# Patient Record
Sex: Female | Born: 1950 | Race: White | Hispanic: No | Marital: Married | Smoking: Former smoker
Health system: Southern US, Community
[De-identification: ages and names within clinical notes are randomized; demographics above are authoritative.]

## PROBLEM LIST (undated history)

## (undated) DIAGNOSIS — F32A Depression, unspecified: Secondary | ICD-10-CM

## (undated) DIAGNOSIS — F329 Major depressive disorder, single episode, unspecified: Secondary | ICD-10-CM

## (undated) DIAGNOSIS — E7800 Pure hypercholesterolemia, unspecified: Secondary | ICD-10-CM

## (undated) DIAGNOSIS — I10 Essential (primary) hypertension: Secondary | ICD-10-CM

## (undated) DIAGNOSIS — I4819 Other persistent atrial fibrillation: Secondary | ICD-10-CM

## (undated) HISTORY — DX: Major depressive disorder, single episode, unspecified: F32.9

## (undated) HISTORY — DX: Other persistent atrial fibrillation: I48.19

## (undated) HISTORY — DX: Pure hypercholesterolemia, unspecified: E78.00

## (undated) HISTORY — DX: Depression, unspecified: F32.A

## (undated) HISTORY — DX: Essential (primary) hypertension: I10

---

## 2003-03-06 ENCOUNTER — Inpatient Hospital Stay (HOSPITAL_COMMUNITY): Admission: EM | Admit: 2003-03-06 | Discharge: 2003-03-09 | Payer: Self-pay | Admitting: Emergency Medicine

## 2003-03-07 ENCOUNTER — Encounter (INDEPENDENT_AMBULATORY_CARE_PROVIDER_SITE_OTHER): Payer: Self-pay | Admitting: Cardiology

## 2003-03-08 ENCOUNTER — Encounter: Payer: Self-pay | Admitting: Cardiology

## 2004-07-24 ENCOUNTER — Encounter: Admission: RE | Admit: 2004-07-24 | Discharge: 2004-07-24 | Payer: Self-pay | Admitting: Otolaryngology

## 2004-08-18 ENCOUNTER — Other Ambulatory Visit: Admission: RE | Admit: 2004-08-18 | Discharge: 2004-08-18 | Payer: Self-pay | Admitting: Otolaryngology

## 2005-12-25 ENCOUNTER — Other Ambulatory Visit: Admission: RE | Admit: 2005-12-25 | Discharge: 2005-12-25 | Payer: Self-pay | Admitting: Internal Medicine

## 2006-02-04 ENCOUNTER — Encounter: Admission: RE | Admit: 2006-02-04 | Discharge: 2006-02-04 | Payer: Self-pay | Admitting: Internal Medicine

## 2006-12-07 IMAGING — US US SOFT TISSUE HEAD/NECK
1 series · 14 of 25 positions shown · non-contrast
Comparison: none

CLINICAL DATA: Right thyroid nodule/lesion felt on physical exam.
THYROID ULTRASOUND:

[Series 1: unknown · 0.08mm/px · 14 of 45 slices shown]
[im 1/45]
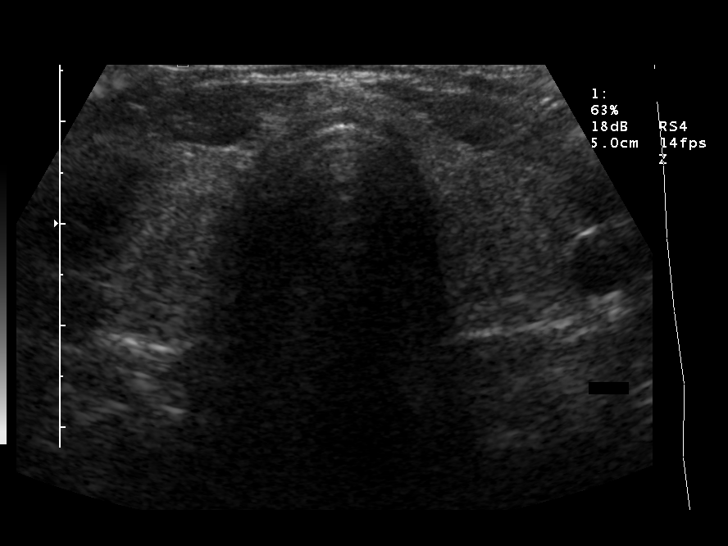
[im 4/45]
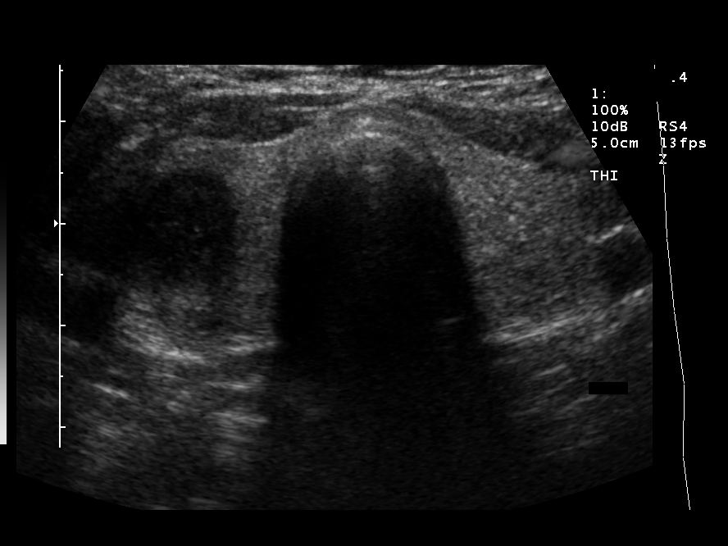
[im 8/45]
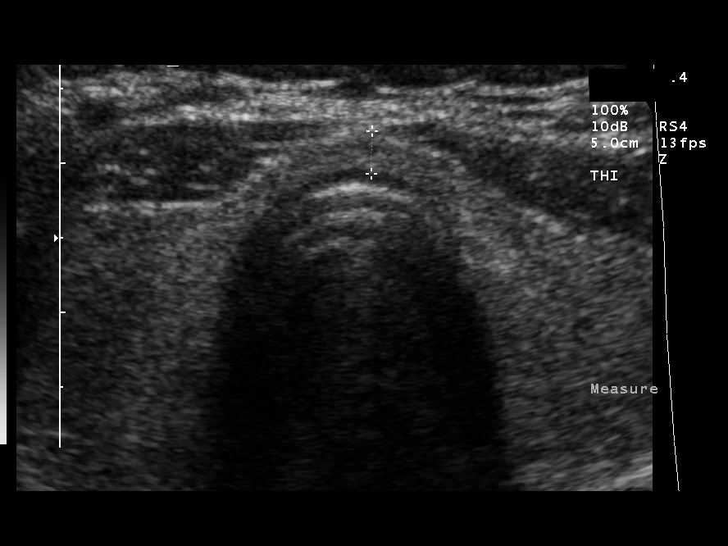
[im 12/45]
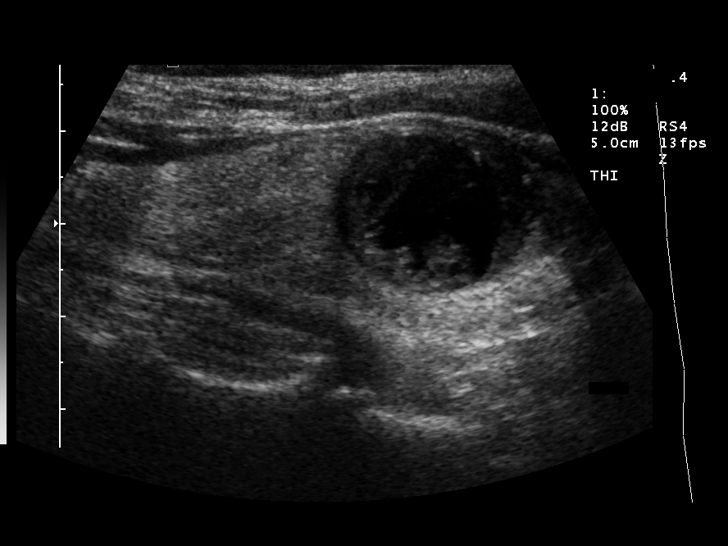
[im 15/45]
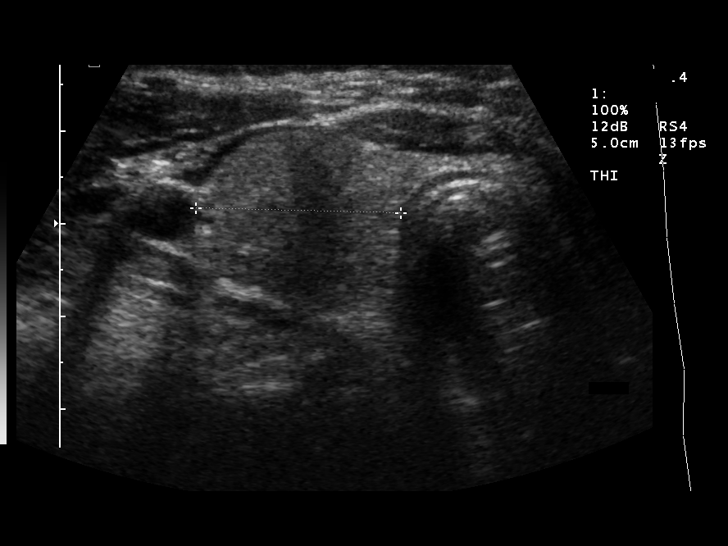
[im 17/45]
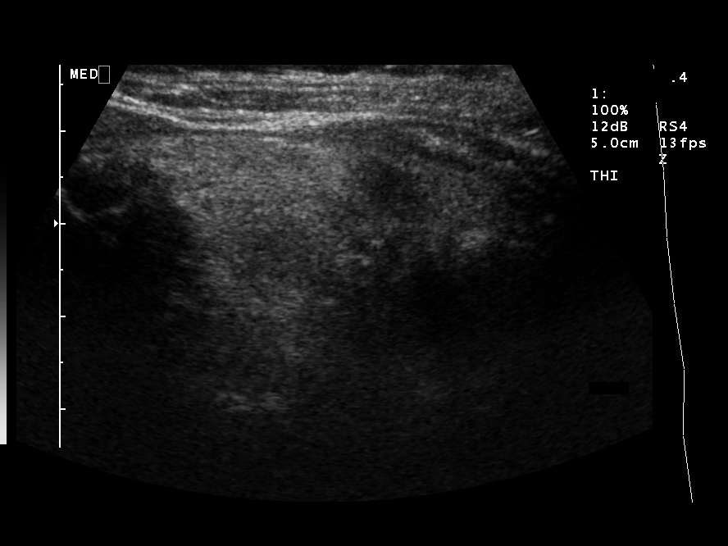
[im 21/45]
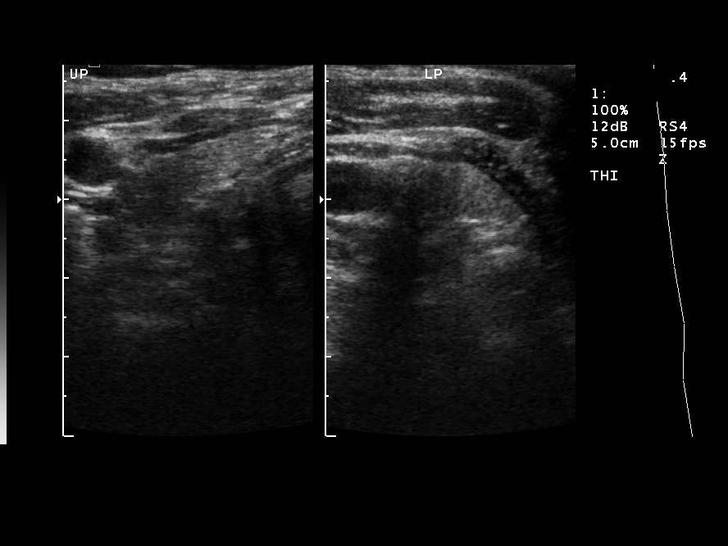
[im 24/45]
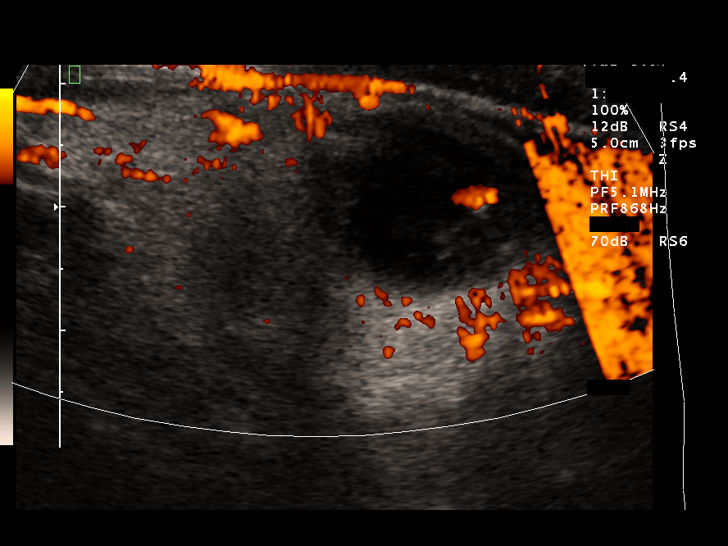
[im 28/45]
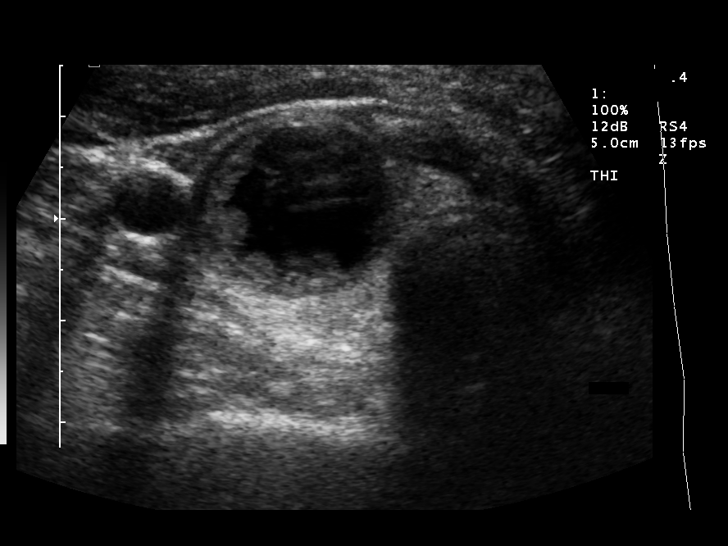
[im 30/45]
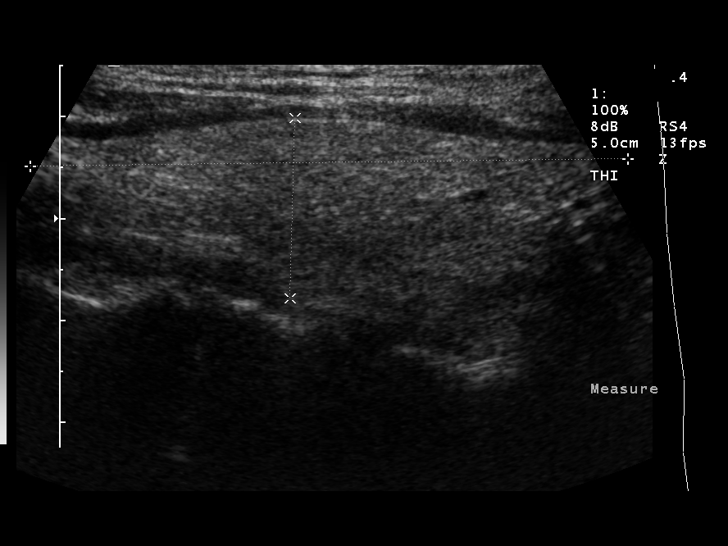
[im 34/45]
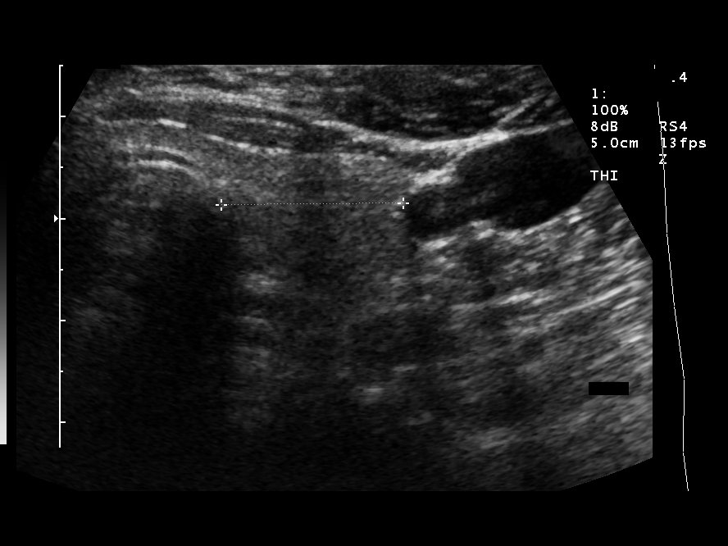
[im 37/45]
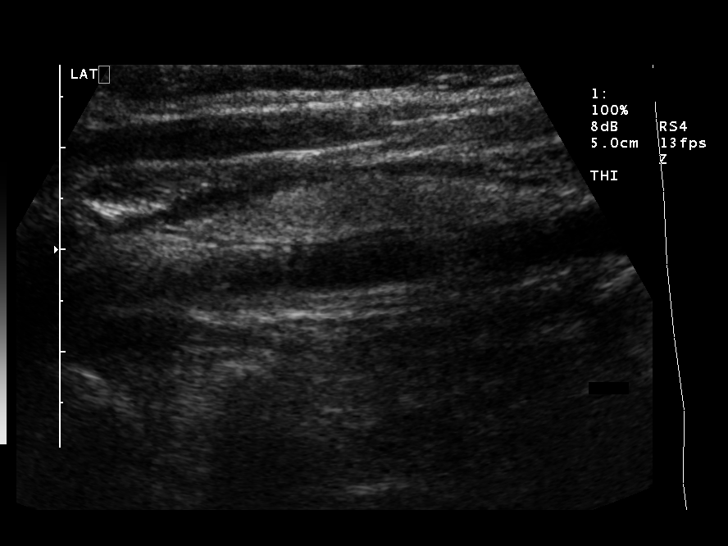
[im 41/45]
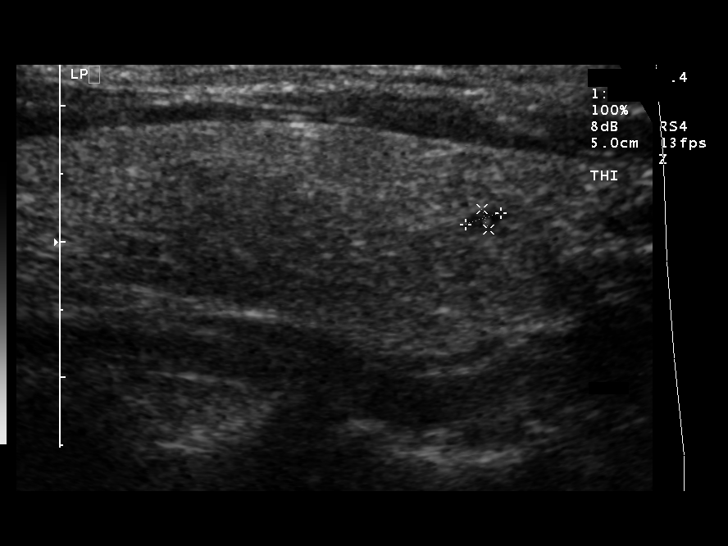
[im 45/45]
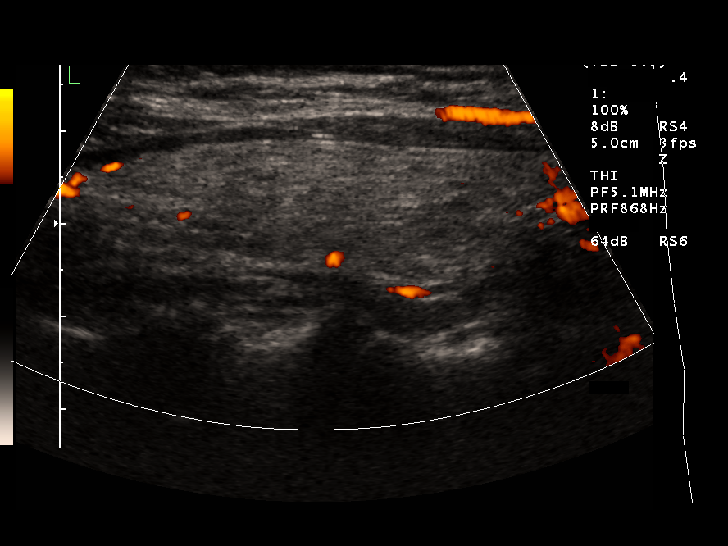

[14 of 25 positions shown; findings below may reference images not displayed]

FINDINGS: A 2.2 x 1.7 x 1.9 cm complex solid and cystic lesion containing small peripheral calcifications within the right mid thyroid is identified.  A 3 mm left lower pole thyroid nodule is also present.  The thyroid is mildly enlarged with the right lobe measuring 6.7 x 2.3 x 2.2 cm and the left lobe measuring 6.1 x 1.8 x 1.8 cm.  The remainder of the thyroid echotexture is homogeneous.
IMPRESSION: 1.  2.2 x 1.7 x 1.9 cm right thyroid nodule - recommend further evaluation as neoplasm is not excluded. 
2.  Mild diffuse thyroid enlargement.

## 2007-01-17 ENCOUNTER — Other Ambulatory Visit: Admission: RE | Admit: 2007-01-17 | Discharge: 2007-01-17 | Payer: Self-pay | Admitting: Internal Medicine

## 2010-07-18 NOTE — H&P (Signed)
NAMESHANTEE, HAYNE                         ACCOUNT NO.:  [id number]   MEDICAL RECORD NO.:  [id number]                   PATIENT TYPE:  INP   LOCATION:  0153                                 FACILITY:  [facility]   PHYSICIAN:  Deirdre Peer. Polite, M.D.              DATE OF BIRTH:  11/01/1950   DATE OF ADMISSION:  03/06/2003  DATE OF DISCHARGE:                                HISTORY & PHYSICAL   CHIEF COMPLAINT:  Shortness of breath and palpitations.   HISTORY OF PRESENT ILLNESS:  Ms. Milner is a 60 year old female who is  fairly healthy and presented to the ED for evaluation of shortness of breath  and palpitations.  The patient states she was in her usual state of health  until this afternoon when she experienced a sensation of fluttering in her  chest and shortness of breath as well as chest heaviness.  The patient  experienced these symptoms while she was at a movie theater.  Because of the  above symptoms the patient presented to the ED for evaluation.  In the ED  the patient was found to be hypertensive with a BP of 203/124 and EKG  showing atrial fibrillation with a rate of 161.  The patient had a complaint  of chest pain and was given nitroglycerin, which she states gave her relief  of her chest discomfort, and the patient was treated with IV medications,  which improved her rate down to 75 beats/minute.  The patient's cardiac  enzymes at this time were within normal limits.  [facility] Hospitalist was  called for further evaluation and treatment.   The patient states that she has not had trouble with her heart before.  No  history of atrial fibrillation, but thinks that approximately four months  ago she felt the sensation of heaviness and palpitations in her chest, which  resolved on their own.   The patient does not smoke, does not have diabetes, no known history of high  cholesterol and no known history of coronary artery disease.  The patient  has no history of thyroid disorder  either.  The patient believes she has not  been feeling well all this week, feeling somewhat short of breath, but no  chest pressure until today as described above.   The patient denies any medications other than a multivitamin.  The patient  drinks alcohol socially.  the patient denies any fever or chills, any nausea  or vomiting, and any diarrhea, but has occasional constipation.  No change  in her stool or urine.   PAST MEDICAL HISTORY:  The past medical history is negative for any current  medical problems.   MEDICATIONS:  Occasional multivitamin.  No herbal medicines.   SOCIAL HISTORY:  Social history is negative for tobacco. Social alcohol;  approximately a glass of wine per day.  No drugs.   PAST SURGICAL HISTORY:  None.   ALLERGIES:  No known drug  allergies.   FAMILY HISTORY:  The patient states that her mother is fairly healthy.  Her  father is deceased of unknown cause.  The patient has a brother and a sister  who are healthy.   REVIEW OF SYSTEMS:  Review of systems is as stated in the initial HPI.   PHYSICAL EXAMINATION:  VITAL SIGNS:  Temp 97.0.  BP on admission 203/124 and  BP at the time of my evaluation 117/82.  Satting 97%.  HEENT:  Within normal limits.  CHEST:  Chest is clear.  CARDIOVASCULAR:  Irregularly irregular.  ABDOMEN:  Abdomen is soft and nontender.  EXTREMITIES:  No edema.  Two plus pulse.   LABORATORY DATA:  Chest x-ray; mo acute abnormality.  EKG; atrial  fibrillation.  CBC within normal limits; white count 7.3, hemoglobin 16.3,  MCV 87.9 and platelets 258,000.  INR 0.7.  BMET; sodium 140, potassium 3.4,  chloride 110, carbon dioxide 25, glucose 102, BUN 7, and creatinine 0.7.  Calcium 9.3.  CK 134, CK MB 2.7, positive index 2.0.  Troponin I 0.01.   ASSESSMENT AND PLAN:  1. Atrial fibrillation, new onset.  2. Chest pain relieved by nitroglycerin.   RECOMMEND:  Recommend the patient be admitted to a telemetry floor bed.  The  patient will  require serial cardiac enzymes to rule out an ischemic cause  for her new onset atrial fibrillation.  The patient will have a TSH level  drawn as well as a lipid panel drawn.  The patient will continue on IV  Cardizem for rate control with hopefully conversion to p.o. medicine in the  morning.  The patient will be started on anticoagulation to prevent embolic  phenomenon for her new onset atrial fibrillation.  We will obtain a 2-D echo  and a cardiac evaluation.  We will make further recommendations after review  of the above studies.                                               Deirdre Peer. Polite, M.D.    RDP/MEDQ  D:  03/06/2003  T:  03/07/2003  Job:  [id number]

## 2010-07-18 NOTE — Discharge Summary (Signed)
Jennifer Webster, Jennifer Webster                         ACCOUNT NO.:  [id number]   MEDICAL RECORD NO.:  [id number]                   PATIENT TYPE:  INP   LOCATION:  4729                                 FACILITY:  [facility]   PHYSICIAN:  Ike Bene, M.D.              DATE OF BIRTH:  1950/09/25   DATE OF ADMISSION:  03/06/2003  DATE OF DISCHARGE:  03/09/2003                                 DISCHARGE SUMMARY   DISCHARGE DIAGNOSES:  1. New-onset atrial fibrillation.  2. Hypertension.   [name] was initially admitted with sudden onset weakness, shortness of  breath, a fluttering sensation she had in her chest.  She was found to be in  atrial fibrillation, was initially admitted to [facility].  She  had not had any particular stimulants or other reasons for her to develop  new-onset atrial fibrillation.  Her rate was initially well controlled.  She  was placed on digoxin and diltiazem and anticoagulated with heparin.  Cardiac isoenzymes were normal.  Thyroid functions, electrolytes, CBC all  essentially were in normal limits.  Cardiac consultation was obtained.  Dr.  Sharyn Lull evaluated the patient.  He obtained an echocardiogram which showed  inferior hypokinesis and arranged for her to have a nuclear medicine  Cardiolite study which apparently was normal.   The patient did convert to normal sinus rhythm on Betapace.  She was feeling  very well at the time of discharge.  Decision was made that she could be  discharged to home on therapy of Coumadin along with Betapace to control her  heart rate and to keep her in normal sinus rhythm.  She was to follow up  with myself and Dr. Sharyn Lull within a couple of weeks after discharge.   On review of her laboratories, all of her laboratories including a CBC,  metabolic panel, thyroid studies, cardiac enzymes, lipid panel - all were  within normal limits.   The patient was discharged to home in significantly improved condition in  normal  sinus rhythm.  The patient was discharged home on the following  medications:  1. Betapace 40 mg one-half tablet twice daily.  2. Coumadin 5 mg daily.   She was to follow up with Dr. Merril Abbe on Monday, March 12, 2003 and  repeat a protime at that time.  Also, to follow up with Dr. Sharyn Lull as he  recommended - which I think was approximately 2 weeks after discharge.                                                Ike Bene, M.D.    RM/MEDQ  D:  04/12/2003  T:  04/12/2003  Job:  [id number]

## 2012-12-07 ENCOUNTER — Ambulatory Visit: Payer: Self-pay | Admitting: Cardiology

## 2013-01-12 ENCOUNTER — Encounter: Payer: Self-pay | Admitting: *Deleted

## 2013-01-12 ENCOUNTER — Encounter: Payer: Self-pay | Admitting: Cardiology

## 2013-01-12 DIAGNOSIS — I10 Essential (primary) hypertension: Secondary | ICD-10-CM | POA: Insufficient documentation

## 2013-01-12 DIAGNOSIS — F329 Major depressive disorder, single episode, unspecified: Secondary | ICD-10-CM | POA: Insufficient documentation

## 2013-01-12 DIAGNOSIS — F32A Depression, unspecified: Secondary | ICD-10-CM | POA: Insufficient documentation

## 2013-01-12 DIAGNOSIS — E7800 Pure hypercholesterolemia, unspecified: Secondary | ICD-10-CM | POA: Insufficient documentation

## 2013-01-13 ENCOUNTER — Encounter: Payer: Self-pay | Admitting: Cardiology

## 2013-01-13 ENCOUNTER — Encounter: Payer: Self-pay | Admitting: General Surgery

## 2013-01-13 ENCOUNTER — Ambulatory Visit (INDEPENDENT_AMBULATORY_CARE_PROVIDER_SITE_OTHER): Payer: Self-pay | Admitting: Cardiology

## 2013-01-13 VITALS — BP 180/100 | HR 130 | Ht 65.0 in | Wt 150.0 lb

## 2013-01-13 DIAGNOSIS — I4819 Other persistent atrial fibrillation: Secondary | ICD-10-CM | POA: Insufficient documentation

## 2013-01-13 DIAGNOSIS — I480 Paroxysmal atrial fibrillation: Secondary | ICD-10-CM

## 2013-01-13 DIAGNOSIS — I4891 Unspecified atrial fibrillation: Secondary | ICD-10-CM

## 2013-01-13 DIAGNOSIS — I10 Essential (primary) hypertension: Secondary | ICD-10-CM

## 2013-01-13 LAB — BASIC METABOLIC PANEL
CO2: 28 mEq/L (ref 19–32)
Calcium: 9.7 mg/dL (ref 8.4–10.5)
Creatinine, Ser: 0.9 mg/dL (ref 0.4–1.2)
Glucose, Bld: 90 mg/dL (ref 70–99)
Potassium: 4.2 mEq/L (ref 3.5–5.1)
Sodium: 138 mEq/L (ref 135–145)

## 2013-01-13 NOTE — Progress Notes (Signed)
  [address], [address] [place], [place]  [address] Phone: [phone] Fax:  [phone]  Date:  01/13/2013   ID:  Jennifer Webster, DOB 1950/07/01, MRN [id number]  PCP:  No primary provider on file.  Cardiologist:  Armanda Magic, MD     History of Present Illness: Jennifer Webster is a 62 y.o. female with a history of PAF and HTN who presents today for followup.  She is doing well.  She denies any chest pain, SOB, DOE, LE edema, dizziness, palpitations or syncope.   Wt Readings from Last 3 Encounters:  01/13/13 150 lb (68.04 kg)     Past Medical History  Diagnosis Date  . Pure hypercholesterolemia   . Depression   . HTN (hypertension)   . Paroxysmal atrial fibrillation     Current Outpatient Prescriptions  Medication Sig Dispense Refill  . aspirin 325 MG tablet Take 325 mg by mouth daily.      . flecainide (TAMBOCOR) 100 MG tablet Take 100 mg by mouth 2 (two) times daily.      Marland Kitchen lisinopril (PRINIVIL,ZESTRIL) 20 MG tablet Take 20 mg by mouth daily.      . metoprolol tartrate (LOPRESSOR) 25 MG tablet Take 12.5 mg by mouth 2 (two) times daily.       No current facility-administered medications for this visit.    Allergies:   No Known Allergies  Social History:  The patient  reports that she has quit smoking. She does not have any smokeless tobacco history on file. She reports that she drinks alcohol. She reports that she does not use illicit drugs.   Family History:  The patient's family history is not on file.   ROS:  Please see the history of present illness.      All other systems reviewed and negative.   PHYSICAL EXAM: VS:  BP 180/100  Pulse 130  Ht 5\' 5"  (1.651 m)  Wt 150 lb (68.04 kg)  BMI 24.96 kg/m2 Well nourished, well developed, in no acute distress HEENT: normal Neck: no JVD Cardiac:  normal S1, S2; RRR; no murmur Lungs:  clear to auscultation bilaterally, no wheezing, rhonchi or rales Abd: soft, nontender, no hepatomegaly Ext: no edema Skin:  warm and dry Neuro:  CNs 2-12 intact, no focal abnormalities noted       ASSESSMENT AND PLAN:  1. PAF maintaining NSR  - continue metoprolol/ASA/Flecainide 2. HTN - markedly elevated.  At home her BP runs 138-157/60-60mmHg  - continue metoprolol and Lisinopril  - check BMET   Followup with me in 6 months  Signed, Armanda Magic, MD 01/13/2013 11:01 AM

## 2013-01-13 NOTE — Patient Instructions (Signed)
Your physician recommends that you continue on your current medications as directed. Please refer to the Current Medication list given to you today.  Your physician recommends that you go to the lab today for Bmet Panel  Your physician wants you to follow-up in: 6 Months with Dr. Sherlyn Lick will receive a reminder letter in the mail two months in advance. If you don't receive a letter, please call our office to schedule the follow-up appointment.

## 2013-05-10 ENCOUNTER — Other Ambulatory Visit: Payer: Self-pay

## 2013-05-10 MED ORDER — METOPROLOL TARTRATE 25 MG PO TABS: 12.5000 mg | ORAL_TABLET | Freq: Two times a day (BID) | ORAL | Status: DC

## 2013-05-15 ENCOUNTER — Other Ambulatory Visit: Payer: Self-pay

## 2013-05-15 ENCOUNTER — Other Ambulatory Visit: Payer: Self-pay | Admitting: *Deleted

## 2013-05-15 MED ORDER — LISINOPRIL 20 MG PO TABS: 20.0000 mg | ORAL_TABLET | Freq: Every day | ORAL | Status: DC

## 2013-05-15 MED ORDER — METOPROLOL TARTRATE 25 MG PO TABS
12.5000 mg | ORAL_TABLET | Freq: Two times a day (BID) | ORAL | Status: DC
Start: 2013-05-15 — End: 2013-05-22

## 2013-05-22 ENCOUNTER — Other Ambulatory Visit: Payer: Self-pay | Admitting: *Deleted

## 2013-05-22 MED ORDER — METOPROLOL TARTRATE 25 MG PO TABS: 12.5000 mg | ORAL_TABLET | Freq: Two times a day (BID) | ORAL | Status: DC

## 2013-09-27 ENCOUNTER — Other Ambulatory Visit: Payer: Self-pay

## 2013-09-27 MED ORDER — LISINOPRIL 20 MG PO TABS: 20.0000 mg | ORAL_TABLET | Freq: Every day | ORAL | Status: DC

## 2013-10-02 ENCOUNTER — Other Ambulatory Visit: Payer: Self-pay | Admitting: *Deleted

## 2013-10-02 MED ORDER — FLECAINIDE ACETATE 100 MG PO TABS: 100.0000 mg | ORAL_TABLET | Freq: Two times a day (BID) | ORAL | Status: DC

## 2013-10-25 ENCOUNTER — Ambulatory Visit: Payer: Self-pay | Admitting: Physician Assistant

## 2013-10-25 ENCOUNTER — Encounter: Payer: Self-pay | Admitting: Physician Assistant

## 2013-10-25 ENCOUNTER — Ambulatory Visit (INDEPENDENT_AMBULATORY_CARE_PROVIDER_SITE_OTHER): Payer: Self-pay | Admitting: Physician Assistant

## 2013-10-25 VITALS — BP 170/110 | HR 60 | Ht 65.0 in | Wt 153.0 lb

## 2013-10-25 DIAGNOSIS — I480 Paroxysmal atrial fibrillation: Secondary | ICD-10-CM

## 2013-10-25 DIAGNOSIS — I4891 Unspecified atrial fibrillation: Secondary | ICD-10-CM

## 2013-10-25 DIAGNOSIS — R030 Elevated blood-pressure reading, without diagnosis of hypertension: Secondary | ICD-10-CM

## 2013-10-25 DIAGNOSIS — IMO0001 Reserved for inherently not codable concepts without codable children: Secondary | ICD-10-CM

## 2013-10-25 DIAGNOSIS — I10 Essential (primary) hypertension: Secondary | ICD-10-CM

## 2013-10-25 MED ORDER — LISINOPRIL 20 MG PO TABS: 20.0000 mg | ORAL_TABLET | Freq: Two times a day (BID) | ORAL | Status: DC

## 2013-10-25 NOTE — Progress Notes (Addendum)
Cardiology Office Note    Date:  10/25/2013   ID:  Jennifer Webster, Jennifer Webster [birth year], MRN [id number]  PCP:  No primary provider on file.  Cardiologist:  Dr. Mayford Knife    History of Present Illness: Jennifer Webster is a 63 y.o. female PAF and HTN who presents today for followup.   She has not had any problems with her atrial fibrillation and has been in NSR since 2005 at the time of diagnosis. She did take coumadin for a while but then it was decided to only continue on ASA to obviate routine monitoring and associated costs. She denies any chest pain, SOB, DOE, LE edema, dizziness, palpitations or syncope. She mows her large yard every week and feels great after doing this. She does not follow up with a primary care doctor and does not have insurance. She refused an ECG this visit as she recently had on one on her last visit in 12/2012.     Recent Labs/Images: 01/13/2013: Creatinine 0.9; Potassium 4.2   No results found.   Wt Readings from Last 3 Encounters:  01/13/13 150 lb (68.04 kg)     Past Medical History  Diagnosis Date  . Pure hypercholesterolemia   . Depression   . HTN (hypertension)   . Paroxysmal atrial fibrillation     Current Outpatient Prescriptions  Medication Sig Dispense Refill  . aspirin 325 MG tablet Take 325 mg by mouth daily.      . flecainide (TAMBOCOR) 100 MG tablet Take 1 tablet (100 mg total) by mouth 2 (two) times daily.  60 tablet  0  . lisinopril (PRINIVIL,ZESTRIL) 20 MG tablet Take 1 tablet (20 mg total) by mouth daily.  30 tablet  2  . metoprolol tartrate (LOPRESSOR) 25 MG tablet Take 0.5 tablets (12.5 mg total) by mouth 2 (two) times daily.  90 tablet  1   No current facility-administered medications for this visit.     Allergies:   Review of patient's allergies indicates no known allergies.   Social History:  The patient  reports that she has quit smoking. She does not have any smokeless tobacco history on file. She reports that she  drinks alcohol. She reports that she does not use illicit drugs.   Family History:  The patient's family history is not on file.   ROS:  Please see the history of present illness. All other systems reviewed and negative.   PHYSICAL EXAM: VS:  There were no vitals taken for this visit. Well nourished, well developed, in no acute distress HEENT: normal Neck: no JVD Cardiac:  normal S1, S2; RRR; no murmur Lungs: clear to auscultation bilaterally, no wheezing, rhonchi or rales Abd: soft, nontender, no hepatomegaly Ext: no edema Skin: warm and dry Neuro:  CNs 2-12 intact, no focal abnormalities noted  EKG:  Patient declined.      ASSESSMENT AND PLAN:  PAF maintaining NSR - continue metoprolol/ASA/Flecainide    HTN - 170/110 markedly elevated. She admits to white coat hypertension. She takes her BP about 1x a month and usually runs around ~148/5mmHg.  - continue metoprolol and Lisinopril. Her BP is not at goal and would ideally like her SBP to be under 140. I recommended adding an additional agent like Norvac 2.5 mg but the patient refused. I told her to keep an eye on her BP and think about adding another agent at her next visit if her pressures remain high. She agreed.   Disposition:  67mo with Dr. Mayford Knife  Signed, Delia Chimes, MHS 10/25/2013 11:05 AM    [facility] [address] [place], [place], [place]  [address] Phone: [phone]; Fax: [phone]

## 2013-10-25 NOTE — Addendum Note (Signed)
Addended by: Tarri Fuller on: 10/25/2013 12:10 PM   Modules accepted: Orders

## 2013-10-25 NOTE — Patient Instructions (Signed)
Your physician wants you to follow-up in: 6 MONTHS WITH DR. Mayford Knife.  You will receive a reminder letter in the mail two months in advance. If you don't receive a letter, please call our office to schedule the follow-up appointment.   NO CHANGES WERE MADE TODAY WITH MEDICATIONS

## 2013-11-07 ENCOUNTER — Other Ambulatory Visit: Payer: Self-pay | Admitting: *Deleted

## 2013-11-07 MED ORDER — FLECAINIDE ACETATE 100 MG PO TABS: 100.0000 mg | ORAL_TABLET | Freq: Two times a day (BID) | ORAL | Status: DC

## 2014-04-04 ENCOUNTER — Other Ambulatory Visit: Payer: Self-pay | Admitting: Cardiology

## 2014-05-30 ENCOUNTER — Other Ambulatory Visit: Payer: Self-pay | Admitting: Cardiology

## 2014-08-02 NOTE — Progress Notes (Signed)
Cardiology Office Note   Date:  08/03/2014   ID:  Jennifer Webster, DOB February 23, 1951, MRN [id number]  PCP:  No primary care provider on file.    Chief Complaint  Patient presents with  . Follow-up    PAF      History of Present Illness: Jennifer Webster is a 64 y.o. female with a history of PAF and HTN who presents today for followup. She is doing well. She denies any chest pain, SOB, DOE, LE edema, dizziness, palpitations or syncope.     Past Medical History  Diagnosis Date  . Pure hypercholesterolemia   . Depression   . HTN (hypertension)   . Paroxysmal atrial fibrillation     History reviewed. No pertinent past surgical history.   Current Outpatient Prescriptions  Medication Sig Dispense Refill  . aspirin 325 MG tablet Take 325 mg by mouth daily.    . flecainide (TAMBOCOR) 100 MG tablet Take 1 tablet (100 mg total) by mouth 2 (two) times daily. 60 tablet 6  . lisinopril (PRINIVIL,ZESTRIL) 20 MG tablet Take 1 tablet (20 mg total) by mouth 2 (two) times daily. 180 tablet 3  . metoprolol tartrate (LOPRESSOR) 25 MG tablet Take 0.5 tablets (12.5 mg total) by mouth 2 (two) times daily. 90 tablet 1   No current facility-administered medications for this visit.    Allergies:   Review of patient's allergies indicates no known allergies.    Social History:  The patient  reports that she has quit smoking. She does not have any smokeless tobacco history on file. She reports that she drinks alcohol. She reports that she does not use illicit drugs.   Family History:  The patient's family history includes Diabetes in her daughter. There is no history of Heart attack.    ROS:  Please see the history of present illness.   Otherwise, review of systems are positive for none.   All other systems are reviewed and negative.    PHYSICAL EXAM: VS:  BP 190/98 mmHg  Pulse 56  Ht 5\' 5"  (1.651 m)  Wt 164 lb 12.8 oz (74.753 kg)  BMI 27.42 kg/m2 , BMI Body mass  index is 27.42 kg/(m^2). GEN: Well nourished, well developed, in no acute distress HEENT: normal Neck: no JVD, carotid bruits, or masses Cardiac: RRR; no murmurs, rubs, or gallops,no edema  Respiratory:  clear to auscultation bilaterally, normal work of breathing GI: soft, nontender, nondistended, + BS MS: no deformity or atrophy Skin: warm and dry, no rash Neuro:  Strength and sensation are intact Psych: euthymic mood, full affect   EKG:  EKG was ordered today and showed sinus bradycardia at 56 bpm with no ST changes and normal intervals    Recent Labs: No results found for requested labs within last 365 days.    Lipid Panel No results found for: CHOL, TRIG, HDL, CHOLHDL, VLDL, LDLCALC, LDLDIRECT    Wt Readings from Last 3 Encounters:  08/03/14 164 lb 12.8 oz (74.753 kg)  10/25/13 153 lb (69.4 kg)  01/13/13 150 lb (68.04 kg)    ASSESSMENT AND PLAN:  1. PAF maintaining NSR - continue metoprolol/ASA/Flecainide 2. HTN - poorly controlled but she has white coat HTN.  I have asked to check her BP daily for a week and call with the results - continue metoprolol and Lisinopril     Current medicines are reviewed at length with the patient  today.  The patient does not have concerns regarding medicines.  The following changes have been made:  no change  Labs/ tests ordered today: See above Assessment and Plan No orders of the defined types were placed in this encounter.     Disposition:   FU with me in 6 months  Signed, Quintella Reichert, MD  08/03/2014 10:21 AM    [facility] [address] [place], [place], [place]  [address] Phone: [phone]; Fax: [phone]

## 2014-08-03 ENCOUNTER — Ambulatory Visit (INDEPENDENT_AMBULATORY_CARE_PROVIDER_SITE_OTHER): Payer: Self-pay | Admitting: Cardiology

## 2014-08-03 ENCOUNTER — Encounter: Payer: Self-pay | Admitting: Cardiology

## 2014-08-03 VITALS — BP 190/98 | HR 56 | Ht 65.0 in | Wt 164.8 lb

## 2014-08-03 DIAGNOSIS — I10 Essential (primary) hypertension: Secondary | ICD-10-CM

## 2014-08-03 DIAGNOSIS — I480 Paroxysmal atrial fibrillation: Secondary | ICD-10-CM

## 2014-08-03 MED ORDER — METOPROLOL TARTRATE 25 MG PO TABS: 12.5000 mg | ORAL_TABLET | Freq: Two times a day (BID) | ORAL | Status: DC

## 2014-08-03 NOTE — Patient Instructions (Signed)

## 2014-08-15 ENCOUNTER — Telehealth: Payer: Self-pay

## 2014-08-15 NOTE — Telephone Encounter (Signed)
Stable BP continue current meds 

## 2014-08-15 NOTE — Telephone Encounter (Signed)
Informed patient of results and verbal understanding expressed.  

## 2014-08-15 NOTE — Telephone Encounter (Signed)
Received letter from patient with recent VS readings taken 2 hours after medications:  6/4: BP 144/65 HR 58 6/5: BP 125/55 HR 60 6/6: BP 137/60 HR 54 6/7: BP 128/59 HR 61 6/8: BP 145/66 HR 60  To Dr. Mayford Knife for review.

## 2014-10-11 ENCOUNTER — Other Ambulatory Visit: Payer: Self-pay | Admitting: Physician Assistant

## 2014-11-07 ENCOUNTER — Other Ambulatory Visit: Payer: Self-pay | Admitting: Cardiology

## 2015-02-08 ENCOUNTER — Ambulatory Visit (INDEPENDENT_AMBULATORY_CARE_PROVIDER_SITE_OTHER): Payer: Medicare Other | Admitting: Cardiology

## 2015-02-08 ENCOUNTER — Encounter: Payer: Self-pay | Admitting: Cardiology

## 2015-02-08 VITALS — BP 146/92 | HR 68 | Ht 65.0 in | Wt 168.4 lb

## 2015-02-08 DIAGNOSIS — I480 Paroxysmal atrial fibrillation: Secondary | ICD-10-CM | POA: Diagnosis not present

## 2015-02-08 DIAGNOSIS — I4891 Unspecified atrial fibrillation: Secondary | ICD-10-CM | POA: Diagnosis not present

## 2015-02-08 DIAGNOSIS — I10 Essential (primary) hypertension: Secondary | ICD-10-CM | POA: Diagnosis not present

## 2015-02-08 LAB — CBC WITH DIFFERENTIAL/PLATELET
BASOS ABS: 0 10*3/uL (ref 0.0–0.1)
Basophils Relative: 0 % (ref 0–1)
EOS PCT: 3 % (ref 0–5)
Eosinophils Absolute: 0.2 10*3/uL (ref 0.0–0.7)
HEMATOCRIT: 42.7 % (ref 36.0–46.0)
Hemoglobin: 14.3 g/dL (ref 12.0–15.0)
LYMPHS ABS: 1.6 10*3/uL (ref 0.7–4.0)
LYMPHS PCT: 24 % (ref 12–46)
MCH: 30.4 pg (ref 26.0–34.0)
MCHC: 33.5 g/dL (ref 30.0–36.0)
MCV: 90.7 fL (ref 78.0–100.0)
MONOS PCT: 8 % (ref 3–12)
MPV: 10.1 fL (ref 8.6–12.4)
Monocytes Absolute: 0.5 10*3/uL (ref 0.1–1.0)
Neutro Abs: 4.4 10*3/uL (ref 1.7–7.7)
Neutrophils Relative %: 65 % (ref 43–77)
Platelets: 251 10*3/uL (ref 150–400)
RBC: 4.71 MIL/uL (ref 3.87–5.11)
RDW: 13 % (ref 11.5–15.5)
WBC: 6.7 10*3/uL (ref 4.0–10.5)

## 2015-02-08 LAB — BASIC METABOLIC PANEL
BUN: 13 mg/dL (ref 7–25)
CHLORIDE: 104 mmol/L (ref 98–110)
CO2: 25 mmol/L (ref 20–31)
Calcium: 9.5 mg/dL (ref 8.6–10.4)
Creat: 1.02 mg/dL — ABNORMAL HIGH (ref 0.50–0.99)
GLUCOSE: 92 mg/dL (ref 65–99)
POTASSIUM: 4.3 mmol/L (ref 3.5–5.3)
Sodium: 139 mmol/L (ref 135–146)

## 2015-02-08 MED ORDER — RIVAROXABAN 20 MG PO TABS: 20.0000 mg | ORAL_TABLET | Freq: Every day | ORAL | Status: DC

## 2015-02-08 MED ORDER — FLECAINIDE ACETATE 100 MG PO TABS: 100.0000 mg | ORAL_TABLET | Freq: Two times a day (BID) | ORAL | Status: DC

## 2015-02-08 NOTE — Patient Instructions (Addendum)
Medication Instructions:  1) STOP ASPIRIN 2) START XARELTO 20 mg daily  Labwork: TODAY: BMET, CBC  Testing/Procedures: None  Follow-Up: Your physician wants you to follow-up in: 6 months with Dr. Mayford Knifeurner. You will receive a reminder letter in the mail two months in advance. If you don't receive a letter, please call our office to schedule the follow-up appointment.   Any Other Special Instructions Will Be Listed Below (If Applicable).     If you need a refill on your cardiac medications before your next appointment, please call your pharmacy.

## 2015-02-08 NOTE — Progress Notes (Signed)
Cardiology Office Note   Date:  02/08/2015   ID:  Jennifer, Webster Jun 28, 1950, MRN [id number]  PCP:  No PCP Per Patient    Chief Complaint  Patient presents with  . Atrial Fibrillation    pt has no complaints  . Hypertension      History of Present Illness: Jennifer Webster is a 64 y.o. female with a history of PAF and HTN who presents today for followup. She is doing well. She denies any chest pain, SOB, DOE, LE edema, dizziness, palpitations, claudication or syncope.   Past Medical History  Diagnosis Date  . Pure hypercholesterolemia   . Depression   . HTN (hypertension)   . Paroxysmal atrial fibrillation (HCC)     No past surgical history on file.   Current Outpatient Prescriptions  Medication Sig Dispense Refill  . aspirin 325 MG tablet Take 325 mg by mouth daily.    . flecainide (TAMBOCOR) 100 MG tablet Take 1 tablet (100 mg total) by mouth 2 (two) times daily. 60 tablet 1  . lisinopril (PRINIVIL,ZESTRIL) 20 MG tablet TAKE ONE TABLET BY MOUTH TWICE DAILY 180 tablet 1  . metoprolol tartrate (LOPRESSOR) 25 MG tablet Take 0.5 tablets (12.5 mg total) by mouth 2 (two) times daily. 90 tablet 3   No current facility-administered medications for this visit.    Allergies:   Review of patient's allergies indicates no known allergies.    Social History:  The patient  reports that she has quit smoking. She does not have any smokeless tobacco history on file. She reports that she drinks alcohol. She reports that she does not use illicit drugs.   Family History:  The patient's family history includes Diabetes in her daughter. There is no history of Heart attack.    ROS:  Please see the history of present illness.   Otherwise, review of systems are positive for none.   All other systems are reviewed and negative.    PHYSICAL EXAM: VS:  BP 146/92 mmHg  Pulse 68  Ht  (1.651 m)  Wt 168 lb 6.4 oz (76.386 kg)  BMI 28.02 kg/m2  SpO2 96% ,  BMI Body mass index is 28.02 kg/(m^2). GEN: Well nourished, well developed, in no acute distress HEENT: normal Neck: no JVD, carotid bruits, or masses Cardiac: RRR; no murmurs, rubs, or gallops,no edema  Respiratory:  clear to auscultation bilaterally, normal work of breathing GI: soft, nontender, nondistended, + BS MS: no deformity or atrophy Skin: warm and dry, no rash Neuro:  Strength and sensation are intact Psych: euthymic mood, full affect   EKG:  EKG is not ordered today.    Recent Labs: No results found for requested labs within last 365 days.    Lipid Panel No results found for: CHOL, TRIG, HDL, CHOLHDL, VLDL, LDLCALC, LDLDIRECT    Wt Readings from Last 3 Encounters:  02/08/15 168 lb 6.4 oz (76.386 kg)  08/03/14 164 lb 12.8 oz (74.753 kg)  10/25/13 153 lb (69.4 kg)    ASSESSMENT AND PLAN:  1. PAF maintaining NSR - continue metoprolol/ASA/Flecainide  - Her CHADS2VASC score is now 3 (HTN, female and age >45).  I have recommended stopping ASA and starting Xarelto  daily.  I will check baseline BMET and CBC.     2.       HTN -  Borderline controlled but she has white coat HTN. -  continue metoprolol and Lisinopril  Current medicines are reviewed at length with the patient today.  The patient does not have concerns regarding medicines.  The following changes have been made:  no change  Labs/ tests ordered today: See above Assessment and Plan No orders of the defined types were placed in this encounter.     Disposition:   FU with me in 6 months  Signed, Quintella ReichertURNER,TRACI R, MD  02/08/2015 11:20 AM    [facility] [address], [place], [place]  [address] Phone: [phone]; Fax: [phone]

## 2015-04-17 ENCOUNTER — Other Ambulatory Visit: Payer: Self-pay | Admitting: Cardiology

## 2015-07-09 ENCOUNTER — Other Ambulatory Visit: Payer: Self-pay | Admitting: Cardiology

## 2015-08-09 ENCOUNTER — Encounter: Payer: Self-pay | Admitting: Cardiology

## 2015-08-09 ENCOUNTER — Ambulatory Visit (INDEPENDENT_AMBULATORY_CARE_PROVIDER_SITE_OTHER): Payer: Medicare Other | Admitting: Cardiology

## 2015-08-09 ENCOUNTER — Other Ambulatory Visit: Payer: Self-pay | Admitting: Cardiology

## 2015-08-09 VITALS — BP 160/98 | HR 53 | Ht 65.0 in | Wt 163.1 lb

## 2015-08-09 DIAGNOSIS — I10 Essential (primary) hypertension: Secondary | ICD-10-CM | POA: Diagnosis not present

## 2015-08-09 DIAGNOSIS — I480 Paroxysmal atrial fibrillation: Secondary | ICD-10-CM

## 2015-08-09 NOTE — Progress Notes (Signed)
Cardiology Office Note    Date:  08/09/2015   ID:  Jennifer Webster, DOB March 24, 1950, MRN [id number]  PCP:  No PCP Per Patient  Cardiologist:  Armanda Magic, MD   Chief Complaint  Patient presents with  . Atrial Fibrillation  . Hypertension    History of Present Illness:  Jennifer Webster is a 65 y.o. female with a history of PAF and HTN who presents today for followup. She is doing well. She denies any chest pain, SOB, DOE, LE edema, dizziness, palpitations, claudication or syncope.    Past Medical History  Diagnosis Date  . Pure hypercholesterolemia   . Depression   . HTN (hypertension)   . Paroxysmal atrial fibrillation (HCC)     No past surgical history on file.  Current Medications: Outpatient Prescriptions Prior to Visit  Medication Sig Dispense Refill  . flecainide (TAMBOCOR) 100 MG tablet Take 1 tablet (100 mg total) by mouth 2 (two) times daily. 60 tablet 6  . lisinopril (PRINIVIL,ZESTRIL) 20 MG tablet TAKE ONE TABLET BY MOUTH TWICE DAILY 180 tablet 3  . metoprolol tartrate (LOPRESSOR) 25 MG tablet TAKE ONE-HALF TABLET BY MOUTH TWICE DAILY 90 tablet 1  . rivaroxaban (XARELTO) 20 MG TABS tablet Take 1 tablet (20 mg total) by mouth daily with supper. 30 tablet 11   No facility-administered medications prior to visit.     Allergies:   Nickel   Social History   Social History  . Marital Status: Married    Spouse Name: N/A  . Number of Children: N/A  . Years of Education: N/A   Social History Main Topics  . Smoking status: Former Games developer  . Smokeless tobacco: None  . Alcohol Use: Yes     Comment: 2+ per week  . Drug Use: No  . Sexual Activity: Not Asked   Other Topics Concern  . None   Social History Narrative     Family History:  The patient's family history includes Diabetes in her daughter. There is no history of Heart attack.   ROS:   Please see the history of present illness.    ROS All other systems reviewed and are  negative.   PHYSICAL EXAM:   VS:  BP 160/98 mmHg  Pulse 53  Ht  (1.651 m)  Wt 163 lb 1.9 oz (73.991 kg)  BMI 27.14 kg/m2  SpO2 97%   GEN: Well nourished, well developed, in no acute distress HEENT: normal Neck: no JVD, carotid bruits, or masses Cardiac: RRR; no murmurs, rubs, or gallops,no edema.  Intact distal pulses bilaterally.  Respiratory:  clear to auscultation bilaterally, normal work of breathing GI: soft, nontender, nondistended, + BS MS: no deformity or atrophy Skin: warm and dry, no rash Neuro:  Alert and Oriented x 3, Strength and sensation are intact Psych: euthymic mood, full affect  Wt Readings from Last 3 Encounters:  08/09/15 163 lb 1.9 oz (73.991 kg)  02/08/15 168 lb 6.4 oz (76.386 kg)  08/03/14 164 lb 12.8 oz (74.753 kg)      Studies/Labs Reviewed:   EKG:  EKG is ordered today and shows sinus bradycardia at 53bpm with no ST changes.  QTc .  Recent Labs: 02/08/2015: BUN 13; Creat 1.02*; Hemoglobin 14.3; Platelets 251; Potassium 4.3; Sodium 139   Lipid Panel No results found for: CHOL, TRIG, HDL, CHOLHDL, VLDL, LDLCALC, LDLDIRECT  Additional studies/ records that were reviewed today include:  none    ASSESSMENT:    1. Paroxysmal atrial fibrillation (HCC)  2. Essential hypertension      PLAN:  In order of problems listed above:  1. PAF - maintaining NSR.  EKG is stable.  Continue Flecainide/BB/Xarelto.  Check BMET. 2. HTN - BP poorly controlled today on current medications.  She has not been compliant on her low sodium intake.  I have counseled her on a 2gm sodium diet. Continue ACE I and BB. I have asked her to check her BP daily for a week and call with the results.     Medication Adjustments/Labs and Tests Ordered: Current medicines are reviewed at length with the patient today.  Concerns regarding medicines are outlined above.  Medication changes, Labs and Tests ordered today are listed in the Patient Instructions  below.  There are no Patient Instructions on file for this visit.   Signed, Armanda Magicraci Marva Hendryx, MD  08/09/2015 10:15 AM    [facility] [address], [place], [place]  [address] Phone: [phone]; Fax: [phone]

## 2015-08-09 NOTE — Patient Instructions (Signed)
Medication Instructions:  Your physician recommends that you continue on your current medications as directed. Please refer to the Current Medication list given to you today.   Labwork: None  Testing/Procedures: None  Follow-Up: Your physician wants you to follow-up in: 6 months with Dr. Mayford Knife. You will receive a reminder letter in the mail two months in advance. If you don't receive a letter, please call our office to schedule the follow-up appointment.   Any Other Special Instructions Will Be Listed Below (If Applicable). Please check your BLOOD PRESSURE daily for a week and call with results.   Low-Sodium Eating Plan Sodium raises blood pressure and causes water to be held in the body. Getting less sodium from food will help lower your blood pressure, reduce any swelling, and protect your heart, liver, and kidneys. We get sodium by adding salt (sodium chloride) to food. Most of our sodium comes from canned, boxed, and frozen foods. Restaurant foods, fast foods, and pizza are also very high in sodium. Even if you take medicine to lower your blood pressure or to reduce fluid in your body, getting less sodium from your food is important. WHAT IS MY PLAN? Most people should limit their sodium intake to 2,300 mg a day. Your health care provider recommends that you limit your sodium intake to 2 GRAMS a day.  WHAT DO I NEED TO KNOW ABOUT THIS EATING PLAN? For the low-sodium eating plan, you will follow these general guidelines:  Choose foods with a % Daily Value for sodium of less than 5% (as listed on the food label).   Use salt-free seasonings or herbs instead of table salt or sea salt.   Check with your health care provider or pharmacist before using salt substitutes.   Eat fresh foods.  Eat more vegetables and fruits.  Limit canned vegetables. If you do use them, rinse them well to decrease the sodium.   Limit cheese to 1 oz (28 g) per day.   Eat lower-sodium products,  often labeled as "lower sodium" or "no salt added."  Avoid foods that contain monosodium glutamate (MSG). MSG is sometimes added to [place] food and some canned foods.  Check food labels (Nutrition Facts labels) on foods to learn how much sodium is in one serving.  Eat more home-cooked food and less restaurant, buffet, and fast food.  When eating at a restaurant, ask that your food be prepared with less salt, or no salt if possible.  HOW DO I READ FOOD LABELS FOR SODIUM INFORMATION? The Nutrition Facts label lists the amount of sodium in one serving of the food. If you eat more than one serving, you must multiply the listed amount of sodium by the number of servings. Food labels may also identify foods as:  Sodium free--Less than 5 mg in a serving.  Very low sodium--35 mg or less in a serving.  Low sodium--140 mg or less in a serving.  Light in sodium--50% less sodium in a serving. For example, if a food that usually has 300 mg of sodium is changed to become light in sodium, it will have 150 mg of sodium.  Reduced sodium--25% less sodium in a serving. For example, if a food that usually has 400 mg of sodium is changed to reduced sodium, it will have 300 mg of sodium. WHAT FOODS CAN I EAT? Grains Low-sodium cereals, including oats, puffed wheat and rice, and shredded wheat cereals. Low-sodium crackers. Unsalted rice and pasta. Lower-sodium bread.  Vegetables Frozen or fresh vegetables.  Low-sodium or reduced-sodium canned vegetables. Low-sodium or reduced-sodium tomato sauce and paste. Low-sodium or reduced-sodium tomato and vegetable juices.  Fruits Fresh, frozen, and canned fruit. Fruit juice.  Meat and Other Protein Products Low-sodium canned tuna and salmon. Fresh or frozen meat, poultry, seafood, and fish. Lamb. Unsalted nuts. Dried beans, peas, and lentils without added salt. Unsalted canned beans. Homemade soups without salt. Eggs.  Dairy Milk. Soy milk. Ricotta cheese.  Low-sodium or reduced-sodium cheeses. Yogurt.  Condiments Fresh and dried herbs and spices. Salt-free seasonings. Onion and garlic powders. Low-sodium varieties of mustard and ketchup. Fresh or refrigerated horseradish. Lemon juice.  Fats and Oils Reduced-sodium salad dressings. Unsalted butter.  Other Unsalted popcorn and pretzels.  The items listed above may not be a complete list of recommended foods or beverages. Contact your dietitian for more options. WHAT FOODS ARE NOT RECOMMENDED? Grains Instant hot cereals. Bread stuffing, pancake, and biscuit mixes. Croutons. Seasoned rice or pasta mixes. Noodle soup cups. Boxed or frozen macaroni and cheese. Self-rising flour. Regular salted crackers. Vegetables Regular canned vegetables. Regular canned tomato sauce and paste. Regular tomato and vegetable juices. Frozen vegetables in sauces. Salted JamaicaFrench fries. Olives. Rosita FirePickles. Relishes. Sauerkraut. Salsa. Meat and Other Protein Products Salted, canned, smoked, spiced, or pickled meats, seafood, or fish. Bacon, ham, sausage, hot dogs, corned beef, chipped beef, and packaged luncheon meats. Salt pork. Jerky. Pickled herring. Anchovies, regular canned tuna, and sardines. Salted nuts. Dairy Processed cheese and cheese spreads. Cheese curds. Blue cheese and cottage cheese. Buttermilk.  Condiments Onion and garlic salt, seasoned salt, table salt, and sea salt. Canned and packaged gravies. Worcestershire sauce. Tartar sauce. Barbecue sauce. Teriyaki sauce. Soy sauce, including reduced sodium. Steak sauce. Fish sauce. Oyster sauce. Cocktail sauce. Horseradish that you find on the shelf. Regular ketchup and mustard. Meat flavorings and tenderizers. Bouillon cubes. Hot sauce. Tabasco sauce. Marinades. Taco seasonings. Relishes. Fats and Oils Regular salad dressings. Salted butter. Margarine. Ghee. Bacon fat.  Other Potato and tortilla chips. Corn chips and puffs. Salted popcorn and pretzels.  Canned or dried soups. Pizza. Frozen entrees and pot pies.  The items listed above may not be a complete list of foods and beverages to avoid. Contact your dietitian for more information.   This information is not intended to replace advice given to you by your health care provider. Make sure you discuss any questions you have with your health care provider.   Document Released: 08/08/2001 Document Revised: 03/09/2014 Document Reviewed: 12/21/2012 Elsevier Interactive Patient Education [employer]2016 Elsevier Inc.     If you need a refill on your cardiac medications before your next appointment, please call your pharmacy.

## 2015-08-19 ENCOUNTER — Telehealth: Payer: Self-pay | Admitting: Cardiology

## 2015-08-19 NOTE — Telephone Encounter (Signed)
Left message to call back  

## 2015-08-19 NOTE — Telephone Encounter (Signed)
New Message:  Pt is calling in to give some BP readings to Dr. Norris Crossurner's nurse. Please f/u with her.

## 2015-08-20 NOTE — Telephone Encounter (Signed)
Follow-up   The pt is returning Katy's phone call, the pt is getting ready to leave, will be back later today

## 2015-08-20 NOTE — Telephone Encounter (Signed)
Received letter from patient today:  "Dear Dr. Mayford Knifeurner:  Per my 6 mo visit on August 09, 2015, you wanted me to take my blood pressure for a week - (2 hrs after my morning meds -) (after b'fast) -  I have frustratingly been unable to call in the results to the office as you instructed -- new phone menu?? - Long holds! - Just awful -   Finally talked to "Regina" on Monday, June 19 - Long hold - then, told "Geoffery SpruceKatey" (?) would call me back - waited - we have other things to do today - cannot wait any longer for now.   So, here are the blood pressure readings you requested: Sunday June 11 - 119/62  Pulse 73 Monday June 12 - 122/72  Pulse 62 Tues. June 13 - 130/66  Pulse 55 Wed. June 14 - 130/70  Pulse 56 Thurs. June 15 - 118/63  Pulse 61 Fri. June 16 - 122/71  Pulse 55 and 122/66 Pulse 70 5PM Monday June 19 122/67 Pulse 55"   Left message to call back.

## 2015-08-20 NOTE — Telephone Encounter (Signed)
Left message to call back  

## 2015-08-21 ENCOUNTER — Telehealth: Payer: Self-pay | Admitting: Cardiology

## 2015-08-21 NOTE — Telephone Encounter (Signed)
Jennifer Webster at 08/21/2015 10:12 AM     Status: Signed       Expand All Collapse All   F/u Message  Pt returning RN call. Pt states it is okay to leave a detailed message on VM. please call back to discuss        Informed patient that VS are stable and to continue current treatment plan. Apologized to patient for any inconvenience that was caused with answering service. She was grateful for call.

## 2015-08-21 NOTE — Telephone Encounter (Signed)
F/u Message  Pt returning RN call. Pt states it is okay to leave a detailed message on VM. please call back to discuss

## 2016-01-20 ENCOUNTER — Other Ambulatory Visit: Payer: Self-pay | Admitting: Cardiology

## 2016-02-11 ENCOUNTER — Encounter: Payer: Self-pay | Admitting: Cardiology

## 2016-02-11 ENCOUNTER — Ambulatory Visit (INDEPENDENT_AMBULATORY_CARE_PROVIDER_SITE_OTHER): Payer: Medicare Other | Admitting: Cardiology

## 2016-02-11 VITALS — BP 170/92 | HR 72 | Ht 65.0 in | Wt 165.0 lb

## 2016-02-11 DIAGNOSIS — I4819 Other persistent atrial fibrillation: Secondary | ICD-10-CM

## 2016-02-11 DIAGNOSIS — I481 Persistent atrial fibrillation: Secondary | ICD-10-CM

## 2016-02-11 DIAGNOSIS — I10 Essential (primary) hypertension: Secondary | ICD-10-CM | POA: Diagnosis not present

## 2016-02-11 LAB — CBC WITH DIFFERENTIAL/PLATELET
BASOS ABS: 0 {cells}/uL (ref 0–200)
Basophils Relative: 0 %
EOS PCT: 3 %
Eosinophils Absolute: 204 cells/uL (ref 15–500)
HCT: 40.2 % (ref 35.0–45.0)
Hemoglobin: 13.7 g/dL (ref 11.7–15.5)
LYMPHS PCT: 26 %
Lymphs Abs: 1768 cells/uL (ref 850–3900)
MCH: 30.6 pg (ref 27.0–33.0)
MCHC: 34.1 g/dL (ref 32.0–36.0)
MCV: 89.9 fL (ref 80.0–100.0)
MPV: 9.6 fL (ref 7.5–12.5)
Monocytes Absolute: 544 cells/uL (ref 200–950)
Monocytes Relative: 8 %
NEUTROS PCT: 63 %
Neutro Abs: 4284 cells/uL (ref 1500–7800)
Platelets: 234 10*3/uL (ref 140–400)
RBC: 4.47 MIL/uL (ref 3.80–5.10)
RDW: 13.3 % (ref 11.0–15.0)
WBC: 6.8 10*3/uL (ref 3.8–10.8)

## 2016-02-11 LAB — BASIC METABOLIC PANEL
BUN: 16 mg/dL (ref 7–25)
CALCIUM: 9 mg/dL (ref 8.6–10.4)
CO2: 26 mmol/L (ref 20–31)
CREATININE: 1.01 mg/dL — AB (ref 0.50–0.99)
Chloride: 106 mmol/L (ref 98–110)
GLUCOSE: 85 mg/dL (ref 65–99)
Potassium: 4.4 mmol/L (ref 3.5–5.3)
Sodium: 141 mmol/L (ref 135–146)

## 2016-02-11 NOTE — Progress Notes (Signed)
Cardiology Office Note    Date:  02/11/2016   ID:  Jennifer Webster, DOB March 14, 1950, MRN [id number]  PCP:  No PCP Per Patient  Cardiologist:  Armanda Magicraci Epifanio Labrador, MD   Chief Complaint  Patient presents with  . Atrial Fibrillation  . Hypertension    History of Present Illness:  Jennifer Webster is a 65 y.o. female with a history of PAF and HTN who presents today for followup. She is doing well. She denies any chest pain, SOB, DOE, LE edema, dizziness, palpitations, claudication or syncope   Past Medical History:  Diagnosis Date  . Depression   . HTN (hypertension)   . Persistent atrial fibrillation (HCC)   . Pure hypercholesterolemia     No past surgical history on file.  Current Medications: Outpatient Medications Prior to Visit  Medication Sig Dispense Refill  . flecainide (TAMBOCOR) 100 MG tablet Take 1 tablet (100 mg total) by mouth 2 (two) times daily. 60 tablet 11  . lisinopril (PRINIVIL,ZESTRIL) 20 MG tablet TAKE ONE TABLET BY MOUTH TWICE DAILY 180 tablet 3  . metoprolol tartrate (LOPRESSOR) 25 MG tablet TAKE ONE-HALF TABLET BY MOUTH TWICE DAILY 90 tablet 1  . rivaroxaban (XARELTO) 20 MG TABS tablet Take 1 tablet (20 mg total) by mouth daily with supper. 30 tablet 11   No facility-administered medications prior to visit.      Allergies:   Nickel   Social History   Social History  . Marital status: Married    Spouse name: N/A  . Number of children: N/A  . Years of education: N/A   Social History Main Topics  . Smoking status: Former Games developermoker  . Smokeless tobacco: Never Used  . Alcohol use Yes     Comment: 2+ per week  . Drug use: No  . Sexual activity: Not Asked   Other Topics Concern  . None   Social History Narrative  . None     Family History:  The patient's family history includes Diabetes in her daughter.   ROS:   Please see the history of present illness.    ROS All other systems reviewed and are negative.  No flowsheet data  found.     PHYSICAL EXAM:   VS:  BP (!) 170/92   Pulse 72   Ht 5\' 5"  (1.651 m)   Wt 165 lb (74.8 kg)   BMI 27.46 kg/m    GEN: Well nourished, well developed, in no acute distress  HEENT: normal  Neck: no JVD, carotid bruits, or masses Cardiac: RRR; no murmurs, rubs, or gallops,no edema.  Intact distal pulses bilaterally.  Respiratory:  clear to auscultation bilaterally, normal work of breathing GI: soft, nontender, nondistended, + BS MS: no deformity or atrophy  Skin: warm and dry, no rash Neuro:  Alert and Oriented x 3, Strength and sensation are intact Psych: euthymic mood, full affect  Wt Readings from Last 3 Encounters:  02/11/16 165 lb (74.8 kg)  08/09/15 163 lb 1.9 oz (74 kg)  02/08/15 168 lb 6.4 oz (76.4 kg)      Studies/Labs Reviewed:   EKG:  EKG is not ordered today.    Recent Labs: No results found for requested labs within last 8760 hours.   Lipid Panel No results found for: CHOL, TRIG, HDL, CHOLHDL, VLDL, LDLCALC, LDLDIRECT  Additional studies/ records that were reviewed today include:  none    ASSESSMENT:    1. Persistent atrial fibrillation (HCC)   2. Essential hypertension  PLAN:  In order of problems listed above:  1. Persistent atrial fibrillation maintaining NSR.  Continue flecainide, BB and Xarelto.  Check NOAC. 2. HTN - BP poorly controlled on current meds.  I have asked her to check her BP daily for a week and call with the results. Continue ACE I and BB.    Medication Adjustments/Labs and Tests Ordered: Current medicines are reviewed at length with the patient today.  Concerns regarding medicines are outlined above.  Medication changes, Labs and Tests ordered today are listed in the Patient Instructions below.  There are no Patient Instructions on file for this visit.   Signed, Armanda Magicraci Deone Leifheit, MD  02/11/2016 10:05 AM    [facility] [address], [place], [place]  [address] Phone: [phone]; Fax:  [phone]

## 2016-02-11 NOTE — Patient Instructions (Signed)
Medication Instructions:  Your physician recommends that you continue on your current medications as directed. Please refer to the Current Medication list given to you today.   Labwork: TODAY: BMET, CBC  Testing/Procedures: None  Follow-Up: Your physician wants you to follow-up in: 6 months with Dr. Turner. You will receive a reminder letter in the mail two months in advance. If you don't receive a letter, please call our office to schedule the follow-up appointment.   Any Other Special Instructions Will Be Listed Below (If Applicable). Please check your BLOOD PRESSURE daily for a week and call with results.    If you need a refill on your cardiac medications before your next appointment, please call your pharmacy.   

## 2016-02-20 ENCOUNTER — Other Ambulatory Visit: Payer: Self-pay | Admitting: *Deleted

## 2016-02-20 ENCOUNTER — Telehealth: Payer: Self-pay | Admitting: Cardiology

## 2016-02-20 MED ORDER — RIVAROXABAN 20 MG PO TABS: 20.0000 mg | ORAL_TABLET | Freq: Every day | ORAL | 3 refills | Status: DC

## 2016-02-20 MED ORDER — RIVAROXABAN 20 MG PO TABS
20.0000 mg | ORAL_TABLET | Freq: Every day | ORAL | 3 refills | Status: DC
Start: 2016-02-20 — End: 2016-02-20

## 2016-02-20 NOTE — Telephone Encounter (Signed)
° °*  STAT* If patient is at the pharmacy, call can be transferred to refill team.   1. Which medications need to be refilled? (please list name of each medication and dose if known)  Xarelto 20 mg  2. Which pharmacy/location (including street and city if local pharmacy) is medication to be [facility] [place]  3. Do they need a 30 day or 90 day supply?  30 day

## 2016-04-30 ENCOUNTER — Other Ambulatory Visit: Payer: Self-pay | Admitting: Cardiology

## 2016-07-10 ENCOUNTER — Other Ambulatory Visit: Payer: Self-pay | Admitting: Cardiology

## 2016-08-08 ENCOUNTER — Other Ambulatory Visit: Payer: Self-pay | Admitting: Cardiology

## 2016-08-21 ENCOUNTER — Ambulatory Visit (INDEPENDENT_AMBULATORY_CARE_PROVIDER_SITE_OTHER): Payer: Medicare Other | Admitting: Cardiology

## 2016-08-21 ENCOUNTER — Encounter: Payer: Self-pay | Admitting: Cardiology

## 2016-08-21 VITALS — BP 150/80 | HR 61 | Ht 65.0 in | Wt 163.4 lb

## 2016-08-21 DIAGNOSIS — I481 Persistent atrial fibrillation: Secondary | ICD-10-CM | POA: Diagnosis not present

## 2016-08-21 DIAGNOSIS — I10 Essential (primary) hypertension: Secondary | ICD-10-CM

## 2016-08-21 DIAGNOSIS — I4819 Other persistent atrial fibrillation: Secondary | ICD-10-CM

## 2016-08-21 NOTE — Patient Instructions (Signed)

## 2016-08-21 NOTE — Progress Notes (Signed)
Cardiology Office Note    Date:  08/21/2016   ID:  Jennifer Webster, DOB 1950/03/20, MRN [id number]  PCP:  Patient, No Pcp Per  Cardiologist:  Armanda Magicraci Turner, MD   Chief Complaint  Patient presents with  . Atrial Fibrillation  . Hypertension    History of Present Illness:  Jennifer CaperJennie A Lappe is a 66 y.o. female with a history of persistent AF and HTN who presents today for followup. She is doing well. She denies any chest pain or pressure, SOB, DOE, PND, orthopnea,  LE edema, dizziness, palpitations, claudication or syncope.  She does notice that if she brushes her teeth hard they may bleed.  She also notices that her eyes are more sensitive to light and her back hurts and she is worried it might be the Xarelto.   Past Medical History:  Diagnosis Date  . Depression   . HTN (hypertension)   . Persistent atrial fibrillation (HCC)   . Pure hypercholesterolemia     History reviewed. No pertinent surgical history.  Current Medications: Current Meds  Medication Sig  . flecainide (TAMBOCOR) 100 MG tablet TAKE ONE TABLET BY MOUTH TWICE DAILY  . lisinopril (PRINIVIL,ZESTRIL) 20 MG tablet TAKE ONE TABLET BY MOUTH TWICE DAILY  . metoprolol tartrate (LOPRESSOR) 25 MG tablet TAKE ONE-HALF TABLET BY MOUTH TWICE DAILY  . rivaroxaban (XARELTO) 20 MG TABS tablet Take 1 tablet (20 mg total) by mouth daily with supper.    Allergies:   Nickel   Social History   Social History  . Marital status: Married    Spouse name: N/A  . Number of children: N/A  . Years of education: N/A   Social History Main Topics  . Smoking status: Former Games developermoker  . Smokeless tobacco: Never Used  . Alcohol use Yes     Comment: 2+ per week  . Drug use: No  . Sexual activity: Not Asked   Other Topics Concern  . None   Social History Narrative  . None     Family History:  The patient's family history includes Diabetes in her daughter.   ROS:   Please see the history of present illness.    ROS All  other systems reviewed and are negative.  No flowsheet data found.     PHYSICAL EXAM:   VS:  BP (!) 150/80   Pulse 61   Ht 5\' 5"  (1.651 m)   Wt 163 lb 6.4 oz (74.1 kg)   SpO2 93%   BMI 27.19 kg/m    GEN: Well nourished, well developed, in no acute distress  HEENT: normal  Neck: no JVD, carotid bruits, or masses Cardiac: RRR; no murmurs, rubs, or gallops,no edema.  Intact distal pulses bilaterally.  Respiratory:  clear to auscultation bilaterally, normal work of breathing GI: soft, nontender, nondistended, + BS MS: no deformity or atrophy  Skin: warm and dry, no rash Neuro:  Alert and Oriented x 3, Strength and sensation are intact Psych: euthymic mood, full affect  Wt Readings from Last 3 Encounters:  08/21/16 163 lb 6.4 oz (74.1 kg)  02/11/16 165 lb (74.8 kg)  08/09/15 163 lb 1.9 oz (74 kg)      Studies/Labs Reviewed:   EKG:  EKG is ordered today.  The ekg ordered today demonstrates sinus bradycardia at 56bpm with LAD and no ST changes  Recent Labs: 02/11/2016: BUN 16; Creat 1.01; Hemoglobin 13.7; Platelets 234; Potassium 4.4; Sodium 141   Lipid Panel No results found for: CHOL, TRIG, HDL,  CHOLHDL, VLDL, LDLCALC, LDLDIRECT  Additional studies/ records that were reviewed today include:  none    ASSESSMENT:    1. Persistent atrial fibrillation (HCC)   2. Essential hypertension      PLAN:  In order of problems listed above:  1. Persistent atrial fibrillation - she is  maintaining NSR.  She will continue flecainide 100mg  BID, Lopressor 12.5mg  BID and Xarelto.  Check NOAC panel today. 2. HTN - BP  Borderline controlled on exam today but she has white coat syndrome.  She will continue Lisinopril 20mg  daily and lopressor.    Medication Adjustments/Labs and Tests Ordered: Current medicines are reviewed at length with the patient today.  Concerns regarding medicines are outlined above.  Medication changes, Labs and Tests ordered today are listed in the Patient  Instructions below.  There are no Patient Instructions on file for this visit.   Signed, Armanda Magic, MD  08/21/2016 11:16 AM    [facility] [address] [place], [place], [place]  [address] Phone: [phone]; Fax: [phone]

## 2016-11-30 ENCOUNTER — Telehealth: Payer: Self-pay | Admitting: Cardiology

## 2016-11-30 NOTE — Telephone Encounter (Signed)
Patient aware that I will place two weeks of samples at the front desk. I also made her aware that we are unable to supply samples ongoing for the remainder of the year. I mentioned patient assistance as well. Patient aware and appreciative.

## 2016-11-30 NOTE — Telephone Encounter (Signed)
New message      Patient calling the office for samples of medication:   1.  What medication and dosage are you requesting samples for?  xarelto   2.  Are you currently out of this medication? Pt is in the donut hole----almost out of medication

## 2017-01-11 ENCOUNTER — Other Ambulatory Visit: Payer: Self-pay | Admitting: Cardiology

## 2017-01-29 ENCOUNTER — Other Ambulatory Visit: Payer: Self-pay | Admitting: Cardiology

## 2017-02-03 ENCOUNTER — Other Ambulatory Visit: Payer: Self-pay | Admitting: Cardiology

## 2017-02-06 ENCOUNTER — Other Ambulatory Visit: Payer: Self-pay | Admitting: Cardiology

## 2017-02-12 ENCOUNTER — Ambulatory Visit: Payer: Medicare Other | Admitting: Cardiology

## 2017-04-15 DIAGNOSIS — S92424A Nondisplaced fracture of distal phalanx of right great toe, initial encounter for closed fracture: Secondary | ICD-10-CM | POA: Diagnosis not present

## 2017-04-15 DIAGNOSIS — W108XXA Fall (on) (from) other stairs and steps, initial encounter: Secondary | ICD-10-CM | POA: Diagnosis not present

## 2017-04-15 DIAGNOSIS — S40012A Contusion of left shoulder, initial encounter: Secondary | ICD-10-CM | POA: Diagnosis not present

## 2017-04-19 DIAGNOSIS — M7731 Calcaneal spur, right foot: Secondary | ICD-10-CM | POA: Diagnosis not present

## 2017-04-19 DIAGNOSIS — S99921A Unspecified injury of right foot, initial encounter: Secondary | ICD-10-CM | POA: Diagnosis not present

## 2017-04-19 DIAGNOSIS — S92411D Displaced fracture of proximal phalanx of right great toe, subsequent encounter for fracture with routine healing: Secondary | ICD-10-CM | POA: Diagnosis not present

## 2017-04-19 DIAGNOSIS — S92414A Nondisplaced fracture of proximal phalanx of right great toe, initial encounter for closed fracture: Secondary | ICD-10-CM | POA: Diagnosis not present

## 2017-04-19 DIAGNOSIS — S92514A Nondisplaced fracture of proximal phalanx of right lesser toe(s), initial encounter for closed fracture: Secondary | ICD-10-CM | POA: Diagnosis not present

## 2017-04-19 DIAGNOSIS — S92421D Displaced fracture of distal phalanx of right great toe, subsequent encounter for fracture with routine healing: Secondary | ICD-10-CM | POA: Diagnosis not present

## 2017-04-22 ENCOUNTER — Ambulatory Visit (INDEPENDENT_AMBULATORY_CARE_PROVIDER_SITE_OTHER): Payer: Medicare Other | Admitting: Cardiology

## 2017-04-22 ENCOUNTER — Encounter: Payer: Self-pay | Admitting: Cardiology

## 2017-04-22 VITALS — BP 154/88 | HR 66 | Ht 65.0 in | Wt 161.4 lb

## 2017-04-22 DIAGNOSIS — I4819 Other persistent atrial fibrillation: Secondary | ICD-10-CM

## 2017-04-22 DIAGNOSIS — E7800 Pure hypercholesterolemia, unspecified: Secondary | ICD-10-CM | POA: Diagnosis not present

## 2017-04-22 DIAGNOSIS — I481 Persistent atrial fibrillation: Secondary | ICD-10-CM

## 2017-04-22 DIAGNOSIS — I10 Essential (primary) hypertension: Secondary | ICD-10-CM

## 2017-04-22 LAB — CBC
Hematocrit: 39.6 % (ref 34.0–46.6)
Hemoglobin: 13.8 g/dL (ref 11.1–15.9)
MCH: 31.4 pg (ref 26.6–33.0)
MCHC: 34.8 g/dL (ref 31.5–35.7)
MCV: 90 fL (ref 79–97)
PLATELETS: 270 10*3/uL (ref 150–379)
RBC: 4.39 x10E6/uL (ref 3.77–5.28)
RDW: 12.9 % (ref 12.3–15.4)
WBC: 7.7 10*3/uL (ref 3.4–10.8)

## 2017-04-22 LAB — BASIC METABOLIC PANEL
BUN / CREAT RATIO: 16 (ref 12–28)
BUN: 18 mg/dL (ref 8–27)
CO2: 24 mmol/L (ref 20–29)
CREATININE: 1.13 mg/dL — AB (ref 0.57–1.00)
Calcium: 9.6 mg/dL (ref 8.7–10.3)
Chloride: 101 mmol/L (ref 96–106)
GFR calc Af Amer: 58 mL/min/{1.73_m2} — ABNORMAL LOW (ref >59)
GFR calc non Af Amer: 50 mL/min/{1.73_m2} — ABNORMAL LOW (ref >59)
GLUCOSE: 92 mg/dL (ref 65–99)
POTASSIUM: 5 mmol/L (ref 3.5–5.2)
SODIUM: 140 mmol/L (ref 134–144)

## 2017-04-22 NOTE — Patient Instructions (Signed)
Medication Instructions:  Your physician recommends that you continue on your current medications as directed. Please refer to the Current Medication list given to you today.  If you need a refill on your cardiac medications, please contact your pharmacy first.  Labwork: Today for kidney function and complete blood count   Testing/Procedures: None ordered   Follow-Up: Your physician wants you to follow-up in: 6 months with PA. You will receive a reminder letter in the mail two months in advance. If you don't receive a letter, please call our office to schedule the follow-up appointment.  Your physician wants you to follow-up in: 1 year with Dr. Turner. You will receive a reminder letter in the mail two months in advance. If you don't receive a letter, please call our office to schedule the follow-up appointment.  Any Other Special Instructions Will Be Listed Below (If Applicable).   Thank you for choosing [facility]    Rena Khamya Topp, RN  [phone]  If you need a refill on your cardiac medications before your next appointment, please call your pharmacy.   

## 2017-04-22 NOTE — Progress Notes (Signed)
Cardiology Office Note:    Date:  04/22/2017   ID:  Jennifer Webster, DOB [birth year], MRN [id number]  PCP:  Patient, No Pcp Per  Cardiologist:  No primary care provider on file.    Referring MD: No ref. provider found   Chief Complaint  Patient presents with  . Atrial Fibrillation  . Hypertension    History of Present Illness:    Jennifer Webster is a 67 y.o. female with a hx of persistent AF and HTN.  She is here today for followup and is doing well.  Unfortunately she tripped going down the stairs last week and broke 2 toes and is in an orthopedic foot boot.  She did not hit her head.  She has right foot swelling from the broken toes.  She denies any chest pain or pressure, SOB, DOE, PND, orthopnea, dizziness, palpitations or syncope. She is compliant with her meds and is tolerating meds with no SE.     Past Medical History:  Diagnosis Date  . Depression   . HTN (hypertension)   . Persistent atrial fibrillation (HCC)   . Pure hypercholesterolemia     No past surgical history on file.  Current Medications: Current Meds  Medication Sig  . flecainide (TAMBOCOR) 100 MG tablet TAKE ONE TABLET BY MOUTH TWICE DAILY  . lisinopril (PRINIVIL,ZESTRIL) 20 MG tablet TAKE 1 TABLET BY MOUTH TWICE DAILY  . metoprolol tartrate (LOPRESSOR) 25 MG tablet TAKE 1/2 TABLET BY MOUTH TWICE DAILY  . XARELTO 20 MG TABS tablet Take 1 tablet (20 mg total) by mouth daily with supper.     Allergies:   Nickel   Social History   Socioeconomic History  . Marital status: Married    Spouse name: None  . Number of children: None  . Years of education: None  . Highest education level: None  Social Needs  . Financial resource strain: None  . Food insecurity - worry: None  . Food insecurity - inability: None  . Transportation needs - medical: None  . Transportation needs - non-medical: None  Occupational History  . None  Tobacco Use  . Smoking status: Former Games developer  . Smokeless tobacco: Never  Used  Substance and Sexual Activity  . Alcohol use: Yes    Comment: 2+ per week  . Drug use: No  . Sexual activity: None  Other Topics Concern  . None  Social History Narrative  . None     Family History: The patient's family history includes Diabetes in her daughter. There is no history of Heart attack.  ROS:   Please see the history of present illness.    ROS  All other systems reviewed and negative.   EKGs/Labs/Other Studies Reviewed:    The following studies were reviewed today: none  EKG:  EKG is not ordered today.    Recent Labs: No results found for requested labs within last 8760 hours.   Recent Lipid Panel No results found for: CHOL, TRIG, HDL, CHOLHDL, VLDL, LDLCALC, LDLDIRECT  Physical Exam:    VS:  BP (!) 154/88   Pulse 66   Ht 5\' 5"  (1.651 m)   Wt 161 lb 6.4 oz (73.2 kg)   SpO2 98%   BMI 26.86 kg/m     Wt Readings from Last 3 Encounters:  04/22/17 161 lb 6.4 oz (73.2 kg)  08/21/16 163 lb 6.4 oz (74.1 kg)  02/11/16 165 lb (74.8 kg)     GEN:  Well nourished, well developed in no  acute distress HEENT: Normal NECK: No JVD; No carotid bruits LYMPHATICS: No lymphadenopathy CARDIAC: RRR, no murmurs, rubs, gallops RESPIRATORY:  Clear to auscultation without rales, wheezing or rhonchi  ABDOMEN: Soft, non-tender, non-distended MUSCULOSKELETAL:  No edema; No deformity  SKIN: Warm and dry NEUROLOGIC:  Alert and oriented x 3 PSYCHIATRIC:  Normal affect   ASSESSMENT:    1. Persistent atrial fibrillation (HCC)   2. Essential hypertension   3. Pure hypercholesterolemia    PLAN:    In order of problems listed above:  1.  Persistent atrial fibrillation - she is maintaining NSR.  She will continue on flecainide 100mg  daily and metoprolol 12.5mg  BID.  She will continue on Xarelto 20mg  daily for CHADS2VASC score of 3.  She denies any bleeding problems.  I will get a BMET and CBC today.    2.  HTN - BP is borderline controlled on exam today.  Her BP is  likely elevated due to the pain she is having.  She has white coat HTN and her Bp readings at home are normal.  She will continue on Lisinopril 20mg  daily and metoprolol 12.5mg  BID.  3.  Hyperlipidemia - followed by her PCP   Medication Adjustments/Labs and Tests Ordered: Current medicines are reviewed at length with the patient today.  Concerns regarding medicines are outlined above.  No orders of the defined types were placed in this encounter.  No orders of the defined types were placed in this encounter.   Signed, Armanda Magicraci Turner, MD  04/22/2017 9:41 AM    [facility]

## 2017-07-01 ENCOUNTER — Other Ambulatory Visit: Payer: Self-pay | Admitting: Cardiology

## 2017-08-02 ENCOUNTER — Other Ambulatory Visit: Payer: Self-pay | Admitting: Cardiology

## 2017-08-04 ENCOUNTER — Other Ambulatory Visit: Payer: Self-pay | Admitting: Cardiology

## 2017-08-04 NOTE — Telephone Encounter (Signed)
Xarelto 20mg  refill request received, pt is 67 yrs old, wt-73.2kg, Crea-1.13 on 04/22/17, last seen by Dr. Mayford Knifeurner on 04/22/17. CrCl-55.7083ml/min; will send in refill to requested pharmacy.

## 2017-09-06 ENCOUNTER — Other Ambulatory Visit: Payer: Self-pay | Admitting: *Deleted

## 2017-09-06 MED ORDER — RIVAROXABAN 20 MG PO TABS: ORAL_TABLET | ORAL | 5 refills | Status: DC

## 2017-09-06 MED ORDER — FLECAINIDE ACETATE 100 MG PO TABS: 100.0000 mg | ORAL_TABLET | Freq: Two times a day (BID) | ORAL | 5 refills | Status: DC

## 2017-09-06 NOTE — Telephone Encounter (Signed)
Xarelto 20mg  refill request received; pt is 67 yrs old, wt-73.2kg, Crea-1.13 on 04/22/17, last seen by Dr. Mayford Knifeurner on 04/22/17, CrCl-55.5983ml/min; will send in refill to requested pharmacy.

## 2018-03-05 ENCOUNTER — Other Ambulatory Visit: Payer: Self-pay | Admitting: Cardiology

## 2018-03-07 NOTE — Telephone Encounter (Signed)
Pt is a 68 yr old female who saw Dr. Mayford Knife on 04/22/17, wt at that visit was 73.2 Kg. SCr was 1.13 on 04/22/17. CrCl is 55 mL/min. Will refill Xarelto 20mg  QD.

## 2018-04-18 ENCOUNTER — Other Ambulatory Visit: Payer: Self-pay | Admitting: Cardiology

## 2018-05-20 ENCOUNTER — Encounter: Payer: Self-pay | Admitting: Cardiology

## 2018-05-27 ENCOUNTER — Telehealth: Payer: Self-pay

## 2018-05-27 NOTE — Telephone Encounter (Signed)
Jennifer Webster spoke with the patient, she wanted to reschedule her appointment till May or June. She stated she was feeling fine and would contact the office if she had any changes.

## 2018-05-31 ENCOUNTER — Ambulatory Visit: Payer: Medicare Other | Admitting: Cardiology

## 2018-06-08 ENCOUNTER — Other Ambulatory Visit: Payer: Self-pay | Admitting: Cardiology

## 2018-07-15 ENCOUNTER — Other Ambulatory Visit: Payer: Self-pay | Admitting: Cardiology

## 2018-07-15 NOTE — Telephone Encounter (Signed)
Prescription refill request for Xarelto 20mg . 67 yr/ female 73.2 kg: (04-22-2017) CR 1.13 MG/DL (09-14-9676) CrCl: 93.81OF/BPZ  LOV: Dr. Mayford Knife (04-22-2017) On recall to see Dr. Mayford Knife July 14th.

## 2018-07-25 ENCOUNTER — Other Ambulatory Visit: Payer: Self-pay | Admitting: Cardiology

## 2018-08-09 ENCOUNTER — Other Ambulatory Visit: Payer: Self-pay | Admitting: Cardiology

## 2018-09-07 ENCOUNTER — Other Ambulatory Visit: Payer: Self-pay | Admitting: Cardiology

## 2018-09-13 ENCOUNTER — Telehealth: Payer: Self-pay | Admitting: Cardiology

## 2018-09-13 ENCOUNTER — Other Ambulatory Visit: Payer: Self-pay | Admitting: *Deleted

## 2018-09-13 MED ORDER — LISINOPRIL 20 MG PO TABS: 20.0000 mg | ORAL_TABLET | Freq: Two times a day (BID) | ORAL | 1 refills | Status: DC

## 2018-09-13 NOTE — Telephone Encounter (Signed)
New Message     *STAT* If patient is at the pharmacy, call can be transferred to refill team.   1. Which medications need to be refilled? (please list name of each medication and dose if known) Lisinopril 20mg   2. Which pharmacy/location (including street and city if local pharmacy) is medication to be sent to?  [facility] on [address] [phone]  3. Do they need a 30 day or 90 day supply? 90 day supply

## 2018-10-31 ENCOUNTER — Ambulatory Visit: Payer: Medicare Other | Admitting: Cardiology

## 2018-11-17 ENCOUNTER — Other Ambulatory Visit: Payer: Self-pay | Admitting: Cardiology

## 2018-11-29 NOTE — Progress Notes (Signed)
Virtual Visit via Telephone Note   This visit type was conducted due to national recommendations for restrictions regarding the COVID-19 Pandemic (e.g. social distancing) in an effort to limit this patient's exposure and mitigate transmission in our community.  Due to her co-morbid illnesses, this patient is at least at moderate risk for complications without adequate follow up.  This format is felt to be most appropriate for this patient at this time.  All issues noted in this document were discussed and addressed.  A limited physical exam was performed with this format.  Please refer to the patient's chart for her consent to telehealth for [facility]   Evaluation Performed:  Follow-up visit  This visit type was conducted due to national recommendations for restrictions regarding the COVID-19 Pandemic (e.g. social distancing).  This format is felt to be most appropriate for this patient at this time.  All issues noted in this document were discussed and addressed.  No physical exam was performed (except for noted visual exam findings with Video Visits).  Please refer to the patient's chart (MyChart message for video visits and phone note for telephone visits) for the patient's consent to telehealth for [facility].  Date:  11/30/2018   ID:  Jennifer Webster, DOB 07-25-1950, MRN [id number]  Patient Location:  Home  Provider location:   [facility]  PCP:  Patient, No Pcp Per  Cardiologist:  Fransico Him, MD Electrophysiologist:  None   Chief Complaint:  Afib, HTN  History of Present Illness:    Jennifer Webster is a 68 y.o. female who presents via audio/video conferencing for a telehealth visit today.    Jennifer Webster is a 68 y.o. female with a hx of persistentAF and HTN.  She is here today for followup and is doing well.  She denies any chest pain or pressure, SOB, DOE, PND, orthopnea, LE edema, dizziness, palpitations or syncope. She is compliant with her meds and is  tolerating meds with no SE.    The patient does not have symptoms concerning for COVID-19 infection (fever, chills, cough, or new shortness of breath).    Prior CV studies:   The following studies were reviewed today:  none  Past Medical History:  Diagnosis Date  . Depression   . HTN (hypertension)   . Persistent atrial fibrillation   . Pure hypercholesterolemia    No past surgical history on file.   No outpatient medications have been marked as taking for the 11/30/18 encounter (Telemedicine) with Sueanne Margarita, MD.     Allergies:   Nickel   Social History   Tobacco Use  . Smoking status: Former Research scientist (life sciences)  . Smokeless tobacco: Never Used  Substance Use Topics  . Alcohol use: Yes    Comment: 2+ per week  . Drug use: No     Family Hx: The patient's family history includes Diabetes in her daughter. There is no history of Heart attack.  ROS:   Please see the history of present illness.     All other systems reviewed and are negative.   Labs/Other Tests and Data Reviewed:    Recent Labs: No results found for requested labs within last 8760 hours.   Recent Lipid Panel No results found for: CHOL, TRIG, HDL, CHOLHDL, LDLCALC, LDLDIRECT  Wt Readings from Last 3 Encounters:  11/30/18 141 lb (64 kg)  04/22/17 161 lb 6.4 oz (73.2 kg)  08/21/16 163 lb 6.4 oz (74.1 kg)     Objective:  Vital Signs:  BP 119/66   Pulse (!) 56   Ht 5\' 5"  (1.651 m)   Wt 141 lb (64 kg)   SpO2 96%   BMI 23.46 kg/m     ASSESSMENT & PLAN:    1.  Permanent atrial fibrillation -She thinks she has been in NSR -denies any palpitations -continue Flecainide 10mg  BID, Lopressor 12.5mg  BID and Xarelto 20mg  daily   -get BMET and CBC   2.  HTN -continue Lopressor 25mg  BID and Lisinopril 20mg  BID  3.  HLD -followed by PCP  COVID-19 Education: The signs and symptoms of COVID-19 were discussed with the patient and how to seek care for testing (follow up with PCP or arrange E-visit).   The importance of social distancing was discussed today.  Patient Risk:   After full review of this patient's clinical status, I feel that they are at least moderate risk at this time.  Time:   Today, I have spent 20 minutes directly with the patient on telemedicine discussing medical problems including PAF, HTN.  We also reviewed the symptoms of COVID 19 and the ways to protect against contracting the virus with telehealth technology.  I spent an additional 5 minutes reviewing patient's chart including labs.  Medication Adjustments/Labs and Tests Ordered: Current medicines are reviewed at length with the patient today.  Concerns regarding medicines are outlined above.  Tests Ordered: No orders of the defined types were placed in this encounter.  Medication Changes: Meds ordered this encounter  Medications  . lisinopril (ZESTRIL) 20 MG tablet    Sig: Take 1 tablet (20 mg total) by mouth 2 (two) times daily. Please keep upcoming appt with Dr. in September before anymore refills. Final Attempt    Dispense:  180 tablet    Refill:  3    Pt must keep upcoming appt with Dr. in September before anymore refills or refill with PCP. Final Attempt  . metoprolol tartrate (LOPRESSOR) 25 MG tablet    Sig: Take 0.5 tablets (12.5 mg total) by mouth 2 (two) times daily.    Dispense:  90 tablet    Refill:  3    Disposition:  Follow up in 1 year(s)  Signed, , MD  11/30/2018 10:52 AM    [facility]

## 2018-11-30 ENCOUNTER — Other Ambulatory Visit: Payer: Self-pay

## 2018-11-30 ENCOUNTER — Telehealth (INDEPENDENT_AMBULATORY_CARE_PROVIDER_SITE_OTHER): Payer: Medicare Other | Admitting: Cardiology

## 2018-11-30 VITALS — BP 119/66 | HR 56 | Ht 65.0 in | Wt 141.0 lb

## 2018-11-30 DIAGNOSIS — E7800 Pure hypercholesterolemia, unspecified: Secondary | ICD-10-CM | POA: Diagnosis not present

## 2018-11-30 DIAGNOSIS — I10 Essential (primary) hypertension: Secondary | ICD-10-CM

## 2018-11-30 DIAGNOSIS — I4819 Other persistent atrial fibrillation: Secondary | ICD-10-CM | POA: Diagnosis not present

## 2018-11-30 MED ORDER — LISINOPRIL 20 MG PO TABS: 20.0000 mg | ORAL_TABLET | Freq: Two times a day (BID) | ORAL | 3 refills | Status: DC

## 2018-11-30 MED ORDER — METOPROLOL TARTRATE 25 MG PO TABS: 12.5000 mg | ORAL_TABLET | Freq: Two times a day (BID) | ORAL | 3 refills | Status: DC

## 2018-12-12 ENCOUNTER — Other Ambulatory Visit: Payer: Self-pay | Admitting: Cardiology

## 2019-01-02 ENCOUNTER — Other Ambulatory Visit: Payer: Self-pay | Admitting: Cardiology

## 2019-01-02 ENCOUNTER — Telehealth: Payer: Self-pay | Admitting: Pharmacist

## 2019-01-02 MED ORDER — RIVAROXABAN 15 MG PO TABS: 15.0000 mg | ORAL_TABLET | Freq: Every day | ORAL | 5 refills | Status: DC

## 2019-01-02 NOTE — Telephone Encounter (Signed)
Afib indication, last visit 11/30/18, BMET not drawn since February 2019. Using that SCr of 1.13, her CrCl is 48 and she would qualify for dose reduction of Xarelto.  Called pt and left message - need to schedule BMET and CBC.

## 2019-01-02 NOTE — Telephone Encounter (Addendum)
Pt returned call to clinic, states Altha Harm already discussed this with her and mailed her lab orders to have labs drawn in [place] which she has done. Do not see any documentation of this.  Called [facility] in [place] - they state pt has not had labs drawn. Called pt back, she provided me with different phone # to call [place] [phone]. They stated our account # was not logged with the order so the results were never sent.  They will fax results to [place] - verbal SCr on the order was 1.18 12/16/18. Updated CrCl 92mL/min, pt qualifies for lower dose of Xarelto 15mg  daily. New rx sent in, pt is aware of dose change.

## 2019-01-02 NOTE — Telephone Encounter (Signed)
Left message for pt - need BMET and CBC scheduled to ensure she remains on the correct dose of Xarelto.

## 2019-06-12 ENCOUNTER — Ambulatory Visit: Payer: Medicare Other | Admitting: Cardiology

## 2019-06-27 ENCOUNTER — Ambulatory Visit (INDEPENDENT_AMBULATORY_CARE_PROVIDER_SITE_OTHER): Payer: Medicare Other | Admitting: Cardiology

## 2019-06-27 ENCOUNTER — Other Ambulatory Visit: Payer: Self-pay

## 2019-06-27 ENCOUNTER — Encounter: Payer: Self-pay | Admitting: Cardiology

## 2019-06-27 VITALS — BP 178/100 | HR 69 | Ht 65.0 in | Wt 140.2 lb

## 2019-06-27 DIAGNOSIS — E7800 Pure hypercholesterolemia, unspecified: Secondary | ICD-10-CM | POA: Diagnosis not present

## 2019-06-27 DIAGNOSIS — I480 Paroxysmal atrial fibrillation: Secondary | ICD-10-CM | POA: Diagnosis not present

## 2019-06-27 DIAGNOSIS — I10 Essential (primary) hypertension: Secondary | ICD-10-CM

## 2019-06-27 LAB — BASIC METABOLIC PANEL
BUN/Creatinine Ratio: 18 (ref 12–28)
BUN: 20 mg/dL (ref 8–27)
CO2: 21 mmol/L (ref 20–29)
Calcium: 9.5 mg/dL (ref 8.7–10.3)
Chloride: 101 mmol/L (ref 96–106)
Creatinine, Ser: 1.11 mg/dL — ABNORMAL HIGH (ref 0.57–1.00)
GFR calc Af Amer: 59 mL/min/{1.73_m2} — ABNORMAL LOW (ref >59)
GFR calc non Af Amer: 51 mL/min/{1.73_m2} — ABNORMAL LOW (ref >59)
Glucose: 95 mg/dL (ref 65–99)
Potassium: 4.5 mmol/L (ref 3.5–5.2)
Sodium: 137 mmol/L (ref 134–144)

## 2019-06-27 LAB — CBC
Hematocrit: 40.7 % (ref 34.0–46.6)
Hemoglobin: 14.2 g/dL (ref 11.1–15.9)
MCH: 32.5 pg (ref 26.6–33.0)
MCHC: 34.9 g/dL (ref 31.5–35.7)
MCV: 93 fL (ref 79–97)
Platelets: 212 10*3/uL (ref 150–450)
RBC: 4.37 x10E6/uL (ref 3.77–5.28)
RDW: 11.7 % (ref 11.7–15.4)
WBC: 7.8 10*3/uL (ref 3.4–10.8)

## 2019-06-27 NOTE — Patient Instructions (Signed)
Medication Instructions:  Your physician recommends that you continue on your current medications as directed. Please refer to the Current Medication list given to you today.  *If you need a refill on your cardiac medications before your next appointment, please call your pharmacy*  Lab Work: TODAY: CBC, BMET If you have labs (blood work) drawn today and your tests are completely normal, you will receive your results only by: Marland Kitchen MyChart Message (if you have MyChart) OR . A paper copy in the mail If you have any lab test that is abnormal or we need to change your treatment, we will call you to review the results.   Follow-Up: At [facility], you and your health needs are our priority.  As part of our continuing mission to provide you with exceptional heart care, we have created designated Provider Care Teams.  These Care Teams include your primary Cardiologist (physician) and Advanced Practice Providers (APPs -  Physician Assistants and Nurse Practitioners) who all work together to provide you with the care you need, when you need it.  We recommend signing up for the patient portal called "MyChart".  Sign up information is provided on this After Visit Summary.  MyChart is used to connect with patients for Virtual Visits (Telemedicine).  Patients are able to view lab/test results, encounter notes, upcoming appointments, etc.  Non-urgent messages can be sent to your provider as well.   To learn more about what you can do with MyChart, go to [email or web address].    Your next appointment:   1 year(s)  The format for your next appointment:   In Person  Provider:   You may see Armanda Magic, MD or one of the following Advanced Practice Providers on your designated Care Team:    Ronie Spies, PA-C  Jacolyn Reedy, PA-C  Other Instructions: Please take your blood pressure daily for one week and call us with the results.

## 2019-06-27 NOTE — Progress Notes (Signed)
Cardiology Office Note:    Date:  06/27/2019   ID:  DEKISHA Webster, DOB 11-04-1950, MRN [id number]  PCP:  Patient, No Pcp Per  Cardiologist:  Jennifer Him, MD    Referring MD: No ref. provider found   Chief Complaint  Patient presents with  . Atrial Fibrillation  . Hypertension    History of Present Illness:    Jennifer Webster is a 69 y.o. female with a hx of persistentAF and HTN.  She is here today for followup and is doing well.  She denies any chest pain or pressure, SOB, DOE, PND, orthopnea, LE edema, dizziness, palpitations or syncope. She is compliant with her meds and is tolerating meds with no SE.    Past Medical History:  Diagnosis Date  . Depression   . HTN (hypertension)   . Persistent atrial fibrillation ([place])   . Pure hypercholesterolemia     History reviewed. No pertinent surgical history.  Current Medications: Current Meds  Medication Sig  . flecainide (TAMBOCOR) 100 MG tablet Take 1 tablet (100 mg total) by mouth 2 (two) times daily.  Marland Kitchen lisinopril (ZESTRIL) 20 MG tablet Take 1 tablet (20 mg total) by mouth 2 (two) times daily. Please keep upcoming appt with Dr. Radford Pax in September before anymore refills. Final Attempt  . metoprolol tartrate (LOPRESSOR) 25 MG tablet Take 0.5 tablets (12.5 mg total) by mouth 2 (two) times daily.  . Rivaroxaban (XARELTO) 15 MG TABS tablet Take 1 tablet (15 mg total) by mouth daily with supper.  . triamcinolone cream (KENALOG) 0.1 % Apply 1 application topically 2 (two) times daily. As needed     Allergies:   Nickel   Social History   Socioeconomic History  . Marital status: Married    Spouse name: Not on file  . Number of children: Not on file  . Years of education: Not on file  . Highest education level: Not on file  Occupational History  . Not on file  Tobacco Use  . Smoking status: Former Research scientist (life sciences)  . Smokeless tobacco: Never Used  Substance and Sexual Activity  . Alcohol use: Yes    Comment: 2+ per week   . Drug use: No  . Sexual activity: Not on file  Other Topics Concern  . Not on file  Social History Narrative  . Not on file   Social Determinants of Health   Financial Resource Strain:   . Difficulty of Paying Living Expenses:   Food Insecurity:   . Worried About Charity fundraiser in the Last Year:   . Arboriculturist in the Last Year:   Transportation Needs:   . Film/video editor (Medical):   Marland Kitchen Lack of Transportation (Non-Medical):   Physical Activity:   . Days of Exercise per Week:   . Minutes of Exercise per Session:   Stress:   . Feeling of Stress :   Social Connections:   . Frequency of Communication with Friends and Family:   . Frequency of Social Gatherings with Friends and Family:   . Attends Religious Services:   . Active Member of Clubs or Organizations:   . Attends Archivist Meetings:   Marland Kitchen Marital Status:      Family History: The patient's family history includes Diabetes in her daughter. There is no history of Heart attack.  ROS:   Please see the history of present illness.    ROS  All other systems reviewed and negative.   EKGs/Labs/Other Studies  Reviewed:    The following studies were reviewed today: none  EKG:  EKG is  ordered today and showed NSR  Recent Labs: No results found for requested labs within last 8760 hours.   Recent Lipid Panel No results found for: CHOL, TRIG, HDL, CHOLHDL, VLDL, LDLCALC, LDLDIRECT  Physical Exam:    VS:  BP (!) 178/100   Pulse 69   Ht 5\' 5"  (1.651 m)   Wt 140 lb 3.7 oz (63.6 kg)   SpO2 96%   BMI 23.34 kg/m     Wt Readings from Last 3 Encounters:  06/27/19 140 lb 3.7 oz (63.6 kg)  11/30/18 141 lb (64 kg)  04/22/17 161 lb 6.4 oz (73.2 kg)     GEN:  Well nourished, well developed in no acute distress HEENT: Normal NECK: No JVD; No carotid bruits LYMPHATICS: No lymphadenopathy CARDIAC: RRR, no murmurs, rubs, gallops RESPIRATORY:  Clear to auscultation without rales, wheezing or  rhonchi  ABDOMEN: Soft, non-tender, non-distended MUSCULOSKELETAL:  No edema; No deformity  SKIN: Warm and dry NEUROLOGIC:  Alert and oriented x 3 PSYCHIATRIC:  Normal affect   ASSESSMENT:    1. PAF (paroxysmal atrial fibrillation) (HCC)   2. Essential hypertension   3. Pure hypercholesterolemia    PLAN:    In order of problems listed above: 1.  Paroxysmal atrial fibrillation -remains in NSR on exam -denies any palpitations -continue Flecainide 100mg  BID, Lopressor 12.5mg  BID and Xarelto 20mg  daily -check BMET and CBC  2.  HTN -BP poorly controlled on exam but she has a hx of white coat HTN -continue Lopressor 25mg  BID and Lisinopril 20mg  BID -check BMET -I have asked her to check her BP daily for a week and call with results  3.  HLD -followed by PCP   Medication Adjustments/Labs and Tests Ordered: Current medicines are reviewed at length with the patient today.  Concerns regarding medicines are outlined above.  Orders Placed This Encounter  Procedures  . EKG 12-Lead   No orders of the defined types were placed in this encounter.   Signed, 04/24/17, MD  06/27/2019 11:23 AM    [facility]

## 2019-07-06 ENCOUNTER — Telehealth: Payer: Self-pay | Admitting: Cardiology

## 2019-07-06 NOTE — Telephone Encounter (Signed)
Spoke with pt and readings look good Per pt the one B/P that was taken later is showing a lower reading and pt wanted to make sure this was okay .Will forward readings to Dr Mayford Knife for review and recommendations ./cy

## 2019-07-06 NOTE — Telephone Encounter (Signed)
HR a little slow but BP ok.  Is she feeling tired or dizzy

## 2019-07-06 NOTE — Telephone Encounter (Signed)
New Message  Received a phone call from pt who is following Dr. Norris Cross order of giving BP Readings for the week.   137/72; 49 137/70; 45 148/64; 52 148/70; 53 117/59; 67 (around 8 pm) 141/68; 48

## 2019-07-06 NOTE — Telephone Encounter (Signed)
Attempted to call patient unable to LVM  

## 2019-07-07 NOTE — Telephone Encounter (Signed)
Stop lopressor and start Acebutolol 200mg  BID which will not lower HR like lopressor does

## 2019-07-07 NOTE — Telephone Encounter (Signed)
Patient states that she is not experiencing any dizziness but does feel tired often. She denies any other symptoms and reports that she is feeling good other than some fatigue.

## 2019-07-10 MED ORDER — ACEBUTOLOL HCL 200 MG PO CAPS: 200.0000 mg | ORAL_CAPSULE | Freq: Two times a day (BID) | ORAL | 3 refills | Status: DC

## 2019-07-10 NOTE — Telephone Encounter (Signed)
Spoke with the patient - she will discontinue taking Lopressor. Rx has been sent in for acebutolol.

## 2019-08-05 ENCOUNTER — Other Ambulatory Visit: Payer: Self-pay | Admitting: Cardiology

## 2019-08-07 NOTE — Telephone Encounter (Signed)
Pt last saw Dr Mayford Knife 06/27/19, last labs 06/27/19 Creat 1.11, age 69, weight 63.6kg, CrCl 48.03, based on specified criteria pt is on appropriate dosage of Xarelto 15mg  QD.  Will refill rx.

## 2019-12-15 ENCOUNTER — Other Ambulatory Visit: Payer: Self-pay | Admitting: Cardiology

## 2020-01-24 ENCOUNTER — Other Ambulatory Visit: Payer: Self-pay | Admitting: Cardiology

## 2020-03-20 ENCOUNTER — Other Ambulatory Visit: Payer: Self-pay | Admitting: Cardiology

## 2020-03-20 NOTE — Telephone Encounter (Signed)
Xarelto 15mg  refill request received. Pt is 70 years old, weight-63.6kg, Crea-1.11 on 06/27/2019, last seen by Dr. 06/29/2019 on 06/27/2019, Diagnosis-Afib, CrCl-47.55ml/min; Dose is appropriate based on dosing criteria. Will send in refill to requested pharmacy.

## 2020-06-03 DIAGNOSIS — Z23 Encounter for immunization: Secondary | ICD-10-CM | POA: Diagnosis not present

## 2020-06-25 NOTE — Progress Notes (Signed)
Cardiology Office Note:    Date:  06/26/2020   ID:  Jennifer Webster, DOB May 17, 1950, MRN [id number]  PCP:  Patient, No Pcp Per (Inactive)  Cardiologist:  Armanda Magic, MD    Referring MD: No ref. provider found   Chief Complaint  Patient presents with  . Atrial Fibrillation  . Hypertension    History of Present Illness:    Jennifer Webster is a 70 y.o. female with a hx of persistentAF and HTN.  She is here today for followup and is doing well.  She denies any chest pain or pressure, SOB, DOE, PND, orthopnea, LE edema, dizziness, palpitations or syncope. She is compliant with her meds and is tolerating meds with no SE.  Marland Kitchen    Past Medical History:  Diagnosis Date  . Depression   . HTN (hypertension)   . Persistent atrial fibrillation (HCC)   . Pure hypercholesterolemia     No past surgical history on file.  Current Medications: Current Meds  Medication Sig  . acebutolol (SECTRAL) 200 MG capsule Take 1 capsule (200 mg total) by mouth 2 (two) times daily.  . flecainide (TAMBOCOR) 100 MG tablet TAKE 1 TABLET BY MOUTH TWICE A DAY  . lisinopril (ZESTRIL) 20 MG tablet Take 1 tablet by mouth twice daily  . triamcinolone cream (KENALOG) 0.1 % Apply 1 application topically 2 (two) times daily. As needed  . XARELTO 15 MG TABS tablet TAKE 1 TABLET (15 MG TOTAL) BY MOUTH DAILY WITH SUPPER.     Allergies:   Nickel   Social History   Socioeconomic History  . Marital status: Married    Spouse name: Not on file  . Number of children: Not on file  . Years of education: Not on file  . Highest education level: Not on file  Occupational History  . Not on file  Tobacco Use  . Smoking status: Former Games developer  . Smokeless tobacco: Never Used  Vaping Use  . Vaping Use: Never used  Substance and Sexual Activity  . Alcohol use: Yes    Comment: 2+ per week  . Drug use: No  . Sexual activity: Not on file  Other Topics Concern  . Not on file  Social History Narrative  . Not on  file   Social Determinants of Health   Financial Resource Strain: Not on file  Food Insecurity: Not on file  Transportation Needs: Not on file  Physical Activity: Not on file  Stress: Not on file  Social Connections: Not on file     Family History: The patient's family history includes Diabetes in her daughter. There is no history of Heart attack.  ROS:   Please see the history of present illness.    Review of Systems  Musculoskeletal: Negative for arthritis.    All other systems reviewed and negative.   EKGs/Labs/Other Studies Reviewed:    The following studies were reviewed today: none  EKG:  EKG is  ordered today and showed NSR with nonspecific T wave abnormality  Recent Labs: 06/27/2019: BUN 20; Creatinine, Ser 1.11; Hemoglobin 14.2; Platelets 212; Potassium 4.5; Sodium 137   Recent Lipid Panel No results found for: CHOL, TRIG, HDL, CHOLHDL, VLDL, LDLCALC, LDLDIRECT  Physical Exam:    VS:  BP (!) 180/90 (BP Location: Left Arm, Patient Position: Sitting, Cuff Size: Normal)   Ht 5\' 5"  (1.651 m)   Wt 140 lb (63.5 kg)   SpO2 97%   BMI 23.30 kg/m     Wt Readings  from Last 3 Encounters:  06/26/20 140 lb (63.5 kg)  06/27/19 140 lb 3.7 oz (63.6 kg)  11/30/18 141 lb (64 kg)     GEN: Well nourished, well developed in no acute distress HEENT: Normal NECK: No JVD; No carotid bruits LYMPHATICS: No lymphadenopathy CARDIAC:RRR, no murmurs, rubs, gallops RESPIRATORY:  Clear to auscultation without rales, wheezing or rhonchi  ABDOMEN: Soft, non-tender, non-distended MUSCULOSKELETAL:  No edema; No deformity  SKIN: Warm and dry NEUROLOGIC:  Alert and oriented x 3 PSYCHIATRIC:  Normal affect    ASSESSMENT:    1. Persistent atrial fibrillation (HCC)   2. Primary hypertension   3. Pure hypercholesterolemia    PLAN:    In order of problems listed above: 1.  Paroxysmal atrial fibrillation -she is maintaining NSR on exam today with no palpitations -continue  Flecainide 100mg  BID, Sectral 200mg  BID and Xarelto 15mg  daily -check BMET and CBC  2.  HTN -BP again poorly controlled on the office due to white coat HTN -continue Sectral 200mg  BID and change Lisinopril to 40mg  qam -check BMET -She has checked her BP a few times and they range from 119-167/64-23mmHg. -I have asked her to check her BP daily at lunch for a week and call with results.   3.  HLD -followed by PCP   Medication Adjustments/Labs and Tests Ordered: Current medicines are reviewed at length with the patient today.  Concerns regarding medicines are outlined above.  Orders Placed This Encounter  Procedures  . EKG 12-Lead   No orders of the defined types were placed in this encounter.   Signed, , MD  06/26/2020 10:27 AM    [facility]

## 2020-06-26 ENCOUNTER — Other Ambulatory Visit: Payer: Self-pay

## 2020-06-26 ENCOUNTER — Encounter: Payer: Self-pay | Admitting: Cardiology

## 2020-06-26 ENCOUNTER — Ambulatory Visit (INDEPENDENT_AMBULATORY_CARE_PROVIDER_SITE_OTHER): Payer: Medicare Other | Admitting: Cardiology

## 2020-06-26 VITALS — BP 180/90 | Ht 65.0 in | Wt 140.0 lb

## 2020-06-26 DIAGNOSIS — I4819 Other persistent atrial fibrillation: Secondary | ICD-10-CM

## 2020-06-26 DIAGNOSIS — E7800 Pure hypercholesterolemia, unspecified: Secondary | ICD-10-CM | POA: Diagnosis not present

## 2020-06-26 DIAGNOSIS — I10 Essential (primary) hypertension: Secondary | ICD-10-CM

## 2020-06-26 LAB — BASIC METABOLIC PANEL
BUN/Creatinine Ratio: 12 (ref 12–28)
BUN: 14 mg/dL (ref 8–27)
CO2: 24 mmol/L (ref 20–29)
Calcium: 9.1 mg/dL (ref 8.7–10.3)
Chloride: 103 mmol/L (ref 96–106)
Creatinine, Ser: 1.15 mg/dL — ABNORMAL HIGH (ref 0.57–1.00)
Glucose: 101 mg/dL — ABNORMAL HIGH (ref 65–99)
Potassium: 4.3 mmol/L (ref 3.5–5.2)
Sodium: 140 mmol/L (ref 134–144)
eGFR: 51 mL/min/{1.73_m2} — ABNORMAL LOW (ref >59)

## 2020-06-26 LAB — CBC
Hematocrit: 38.2 % (ref 34.0–46.6)
Hemoglobin: 13.1 g/dL (ref 11.1–15.9)
MCH: 32 pg (ref 26.6–33.0)
MCHC: 34.3 g/dL (ref 31.5–35.7)
MCV: 93 fL (ref 79–97)
Platelets: 224 10*3/uL (ref 150–450)
RBC: 4.09 x10E6/uL (ref 3.77–5.28)
RDW: 12.1 % (ref 11.7–15.4)
WBC: 5.4 10*3/uL (ref 3.4–10.8)

## 2020-06-26 MED ORDER — LISINOPRIL 40 MG PO TABS: 40.0000 mg | ORAL_TABLET | Freq: Every day | ORAL | 3 refills | Status: DC

## 2020-06-26 NOTE — Addendum Note (Signed)
Addended by: Theresia Majors on: 06/26/2020 10:42 AM   Modules accepted: Orders

## 2020-06-26 NOTE — Patient Instructions (Signed)
Medication Instructions:  1) START taking your lisinopril 40 mg every morning  *If you need a refill on your cardiac medications before your next appointment, please call your pharmacy*   Lab Work: TODAY: CBC and BMET If you have labs (blood work) drawn today and your tests are completely normal, you will receive your results only by: Marland Kitchen MyChart Message (if you have MyChart) OR . A paper copy in the mail If you have any lab test that is abnormal or we need to change your treatment, we will call you to review the results.  Follow-Up: At [facility], you and your health needs are our priority.  As part of our continuing mission to provide you with exceptional heart care, we have created designated Provider Care Teams.  These Care Teams include your primary Cardiologist (physician) and Advanced Practice Providers (APPs -  Physician Assistants and Nurse Practitioners) who all work together to provide you with the care you need, when you need it.  Your next appointment:   1 year(s)  The format for your next appointment:   In Person  Provider:   You may see Armanda Magic, MD or one of the following Advanced Practice Providers on your designated Care Team:    Ronie Spies, PA-C  Jacolyn Reedy, PA-C    Other Instructions Your physician has requested that you regularly monitor and record your blood pressure readings at home for one week. Please use the same machine at the same time of day to check your readings and record them. Please call us after one week with a list of your readings.

## 2020-07-03 ENCOUNTER — Telehealth: Payer: Self-pay | Admitting: Cardiology

## 2020-07-03 NOTE — Telephone Encounter (Signed)
Spoke with the patient and advised her on recommendations from Dr. Mayford Knife.

## 2020-07-03 NOTE — Telephone Encounter (Signed)
BP readings:  Thurs 4/28 12:52 pm: 145/74/56 Fri 4/29 2pm: 122/58/68 Sat 4/30 2:03pm: 155/77/55 Sun 5/1 1:40 pm: 156/77/50 Mon 5/2 2pm: 137/61/58 Tues 5/3 2:30 pm: 129/59/65 Today 1pm: 124/66/59

## 2020-07-07 ENCOUNTER — Other Ambulatory Visit: Payer: Self-pay | Admitting: Cardiology

## 2020-08-19 ENCOUNTER — Other Ambulatory Visit: Payer: Self-pay | Admitting: Cardiology

## 2020-10-19 ENCOUNTER — Other Ambulatory Visit: Payer: Self-pay | Admitting: Cardiology

## 2020-10-21 NOTE — Telephone Encounter (Signed)
Xarelto 15 mg refill request received. Pt is 70 years old, weight- 63.5 kg, Crea- 1.15 on 06/26/20 via epic, last seen by Dr. Mayford Knife on 06/26/20, Diagnosis- afib, CrCl- 45.63; Dose is appropriate based on dosing criteria. Will send in refill to requested pharmacy.

## 2020-12-10 DIAGNOSIS — Z23 Encounter for immunization: Secondary | ICD-10-CM | POA: Diagnosis not present

## 2020-12-30 DIAGNOSIS — Z23 Encounter for immunization: Secondary | ICD-10-CM | POA: Diagnosis not present

## 2021-04-20 ENCOUNTER — Other Ambulatory Visit: Payer: Self-pay | Admitting: Cardiology

## 2021-04-21 NOTE — Telephone Encounter (Signed)
Pt last saw Dr Radford Pax 06/26/20, last labs 06/26/20 Creat 1.15, age 71, weight 63.5kg, CrCl 44.98, based on CrCl pt is on appropriate dosage of Xarelto 15mg  QD for afib.  Will refill rx.

## 2021-07-20 ENCOUNTER — Other Ambulatory Visit: Payer: Self-pay | Admitting: Cardiology

## 2021-07-21 ENCOUNTER — Other Ambulatory Visit: Payer: Self-pay | Admitting: Cardiology

## 2021-08-01 ENCOUNTER — Encounter: Payer: Self-pay | Admitting: Cardiology

## 2021-08-01 ENCOUNTER — Ambulatory Visit (INDEPENDENT_AMBULATORY_CARE_PROVIDER_SITE_OTHER): Payer: Medicare Other | Admitting: Cardiology

## 2021-08-01 VITALS — BP 178/77 | HR 62 | Ht 65.0 in | Wt 149.4 lb

## 2021-08-01 DIAGNOSIS — E7800 Pure hypercholesterolemia, unspecified: Secondary | ICD-10-CM

## 2021-08-01 DIAGNOSIS — I10 Essential (primary) hypertension: Secondary | ICD-10-CM

## 2021-08-01 DIAGNOSIS — I4819 Other persistent atrial fibrillation: Secondary | ICD-10-CM | POA: Diagnosis not present

## 2021-08-01 NOTE — Progress Notes (Signed)
Cardiology Office Note:    Date:  08/01/2021   ID:  Jennifer Webster, DOB 12/04/1950, MRN [id number]  PCP:  Patient, No Pcp Per (Inactive)  Cardiologist:  Armanda Magic, MD    Referring MD: No ref. provider found   Chief Complaint  Patient presents with   Atrial Fibrillation   Hypertension   Hyperlipidemia    History of Present Illness:    Jennifer Webster is a 71 y.o. female with a hx of persistent AF and HTN.  She is here today for followup and is doing well.  She denies any chest pain or pressure, SOB, DOE, PND, orthopnea, LE edema, dizziness, palpitations or syncope. She is compliant with her meds and is tolerating meds with no SE.     Past Medical History:  Diagnosis Date   Depression    HTN (hypertension)    Persistent atrial fibrillation (HCC)    Pure hypercholesterolemia     History reviewed. No pertinent surgical history.  Current Medications: Current Meds  Medication Sig   acebutolol (SECTRAL) 200 MG capsule TAKE 1 CAPSULE BY MOUTH TWICE A DAY   flecainide (TAMBOCOR) 100 MG tablet Take 1 tablet (100 mg total) by mouth 2 (two) times daily. Please make overdue appt with Dr. Mayford Knife before anymore refills. Thank you 1st attempt   lisinopril (ZESTRIL) 40 MG tablet Take 1 tablet by mouth once daily   XARELTO 15 MG TABS tablet TAKE 1 TABLET BY MOUTH DAILY WITH DINNER     Allergies:   Nickel   Social History   Socioeconomic History   Marital status: Married    Spouse name: Not on file   Number of children: Not on file   Years of education: Not on file   Highest education level: Not on file  Occupational History   Not on file  Tobacco Use   Smoking status: Former   Smokeless tobacco: Never  Vaping Use   Vaping Use: Never used  Substance and Sexual Activity   Alcohol use: Yes    Comment: 2+ per week   Drug use: No   Sexual activity: Not on file  Other Topics Concern   Not on file  Social History Narrative   Not on file   Social Determinants of  Health   Financial Resource Strain: Not on file  Food Insecurity: Not on file  Transportation Needs: Not on file  Physical Activity: Not on file  Stress: Not on file  Social Connections: Not on file     Family History: The patient's family history includes Diabetes in her daughter. There is no history of Heart attack.  ROS:   Please see the history of present illness.    Review of Systems  Musculoskeletal:  Negative for arthritis.   All other systems reviewed and negative.   EKGs/Labs/Other Studies Reviewed:    The following studies were reviewed today: none  EKG:  EKG is ordered today and demonstrates sinus bradycardia at 55bpm with stable intervals from 2022  Recent Labs: No results found for requested labs within last 8760 hours.   Recent Lipid Panel No results found for: CHOL, TRIG, HDL, CHOLHDL, VLDL, LDLCALC, LDLDIRECT  Physical Exam:    VS:  BP (!) 178/77   Pulse 62   Ht 5\' 5"  (1.651 m)   Wt 149 lb 6.4 oz (67.8 kg)   SpO2 96%   BMI 24.86 kg/m     Wt Readings from Last 3 Encounters:  08/01/21 149 lb 6.4 oz (67.8  kg)  06/26/20 140 lb (63.5 kg)  06/27/19 140 lb 3.7 oz (63.6 kg)    GEN: Well nourished, well developed in no acute distress HEENT: Normal NECK: No JVD; No carotid bruits LYMPHATICS: No lymphadenopathy CARDIAC:RRR, no murmurs, rubs, gallops RESPIRATORY:  Clear to auscultation without rales, wheezing or rhonchi  ABDOMEN: Soft, non-tender, non-distended MUSCULOSKELETAL:  No edema; No deformity  SKIN: Warm and dry NEUROLOGIC:  Alert and oriented x 3 PSYCHIATRIC:  Normal affect   ASSESSMENT:    1. Persistent atrial fibrillation (HCC)   2. Primary hypertension   3. Pure hypercholesterolemia    PLAN:    In order of problems listed above: 1.  Paroxysmal atrial fibrillation -She is maintaining normal sinus rhythm on exam today and denies any palpitations -EKG shows normal QRS and PR intervals -Continue prescription drug management flecainide  100 mg twice daily, Sectral 200 mg twice daily and Xarelto 15 mg daily dosed for reduced creatinine clearance -Check bmet in CBC today  2.  HTN -BP elevated on exam today but has known whitecoat hypertension.  Her blood pressures at home are normal -Continue prescription drug management Sectral 200 mg twice daily and lisinopril 40 mg daily -check BMET -I have asked her to check her BP twice daily for a week and call with results  3.  HLD -followed by PCP   Medication Adjustments/Labs and Tests Ordered: Current medicines are reviewed at length with the patient today.  Concerns regarding medicines are outlined above.  Orders Placed This Encounter  Procedures   EKG 12-Lead   No orders of the defined types were placed in this encounter.   Signed, Armanda Magic, MD  08/01/2021 3:42 PM    [facility]

## 2021-08-01 NOTE — Patient Instructions (Signed)
Medication Instructions:  Your physician recommends that you continue on your current medications as directed. Please refer to the Current Medication list given to you today.  *If you need a refill on your cardiac medications before your next appointment, please call your pharmacy*   Lab Work: TODAY: CBC, BMET If you have labs (blood work) drawn today and your tests are completely normal, you will receive your results only by: MyChart Message (if you have MyChart) OR A paper copy in the mail If you have any lab test that is abnormal or we need to change your treatment, we will call you to review the results.   Testing/Procedures: NONE   Follow-Up: At [facility], you and your health needs are our priority.  As part of our continuing mission to provide you with exceptional heart care, we have created designated Provider Care Teams.  These Care Teams include your primary Cardiologist (physician) and Advanced Practice Providers (APPs -  Physician Assistants and Nurse Practitioners) who all work together to provide you with the care you need, when you need it.  We recommend signing up for the patient portal called "MyChart".  Sign up information is provided on this After Visit Summary.  MyChart is used to connect with patients for Virtual Visits (Telemedicine).  Patients are able to view lab/test results, encounter notes, upcoming appointments, etc.  Non-urgent messages can be sent to your provider as well.   To learn more about what you can do with MyChart, go to [email or web address].    Your next appointment:   1 year(s)  The format for your next appointment:   In Person  Provider:   Armanda Magic, MD     Other Instructions Please check your blood pressure twice daily for 1 week and call in readings to Dr. Mayford Knife.   Important Information About Sugar

## 2021-08-01 NOTE — Addendum Note (Signed)
Addended by: Macie Burows on: 08/01/2021 03:45 PM   Modules accepted: Orders

## 2021-08-02 LAB — CBC
Hematocrit: 38.5 % (ref 34.0–46.6)
Hemoglobin: 13.7 g/dL (ref 11.1–15.9)
MCH: 33.3 pg — ABNORMAL HIGH (ref 26.6–33.0)
MCHC: 35.6 g/dL (ref 31.5–35.7)
MCV: 94 fL (ref 79–97)
Platelets: 203 10*3/uL (ref 150–450)
RBC: 4.11 x10E6/uL (ref 3.77–5.28)
RDW: 12.4 % (ref 11.7–15.4)
WBC: 5.8 10*3/uL (ref 3.4–10.8)

## 2021-08-02 LAB — BASIC METABOLIC PANEL
BUN/Creatinine Ratio: 16 (ref 12–28)
BUN: 17 mg/dL (ref 8–27)
CO2: 22 mmol/L (ref 20–29)
Calcium: 9.1 mg/dL (ref 8.7–10.3)
Chloride: 102 mmol/L (ref 96–106)
Creatinine, Ser: 1.07 mg/dL — ABNORMAL HIGH (ref 0.57–1.00)
Glucose: 97 mg/dL (ref 70–99)
Potassium: 4.2 mmol/L (ref 3.5–5.2)
Sodium: 138 mmol/L (ref 134–144)
eGFR: 56 mL/min/{1.73_m2} — ABNORMAL LOW (ref >59)

## 2021-08-11 ENCOUNTER — Telehealth: Payer: Self-pay | Admitting: Cardiology

## 2021-08-11 NOTE — Telephone Encounter (Signed)
Blood pressure readings for Dr. Mayford Knife:  61/142; 61 117/57; 61  140/62; 55 102/50; 71  138/72; 56 103/50; 59  139/72; 58 134/65; 61  128/63; 53 145/64; 62  142/67; 50 140/68; 57  129/53; 54 140/60; 50

## 2021-08-11 NOTE — Telephone Encounter (Signed)
Attempted to return call, left VM.

## 2021-08-20 ENCOUNTER — Other Ambulatory Visit: Payer: Self-pay | Admitting: Cardiology

## 2021-08-21 ENCOUNTER — Other Ambulatory Visit: Payer: Self-pay | Admitting: Cardiology

## 2021-09-23 ENCOUNTER — Other Ambulatory Visit: Payer: Self-pay | Admitting: Cardiology

## 2021-10-31 ENCOUNTER — Other Ambulatory Visit: Payer: Self-pay | Admitting: Cardiology

## 2021-10-31 NOTE — Telephone Encounter (Signed)
Prescription refill request for Xarelto received.  Indication:Afib Last office visit:6/23 Weight:67.8 kg Age:71 Scr:1.0 CrCl:55.23 ml/min  Prescription refilled

## 2022-01-03 DIAGNOSIS — Z23 Encounter for immunization: Secondary | ICD-10-CM | POA: Diagnosis not present

## 2022-02-03 DIAGNOSIS — Z23 Encounter for immunization: Secondary | ICD-10-CM | POA: Diagnosis not present

## 2022-05-04 ENCOUNTER — Other Ambulatory Visit: Payer: Self-pay | Admitting: Cardiology

## 2022-05-04 DIAGNOSIS — I4819 Other persistent atrial fibrillation: Secondary | ICD-10-CM

## 2022-05-04 NOTE — Telephone Encounter (Signed)
Prescription refill request for Xarelto received.  Indication: afib  Last office visit: 08/01/2021, [place] Weight: 67.8 kg  Age: 72 yo  Scr: 1.07, 08/01/2021 CrCl: 51 ml/min   Per dosing criteria pt qualifies for a dose change.

## 2022-05-05 NOTE — Telephone Encounter (Signed)
Yes please increase to '20mg'$  daily

## 2022-05-06 MED ORDER — RIVAROXABAN 20 MG PO TABS: 20.0000 mg | ORAL_TABLET | Freq: Every day | ORAL | 6 refills | Status: DC

## 2022-05-06 NOTE — Telephone Encounter (Signed)
Pt returned call and educated on the xarelto dose change from '15mg'$  qd to '20mg'$  qd and she verbalized understanding. Discontinued xarelto '15mg'$  off medlist at this time.  Spoke with Adam at the pharmacy since sent over both meds and he will d/c the '15mg'$  off the medlist and prepare the med for the pt. Called pt back to update her and she was thankful.

## 2022-08-25 ENCOUNTER — Other Ambulatory Visit: Payer: Self-pay | Admitting: Cardiology

## 2022-08-28 ENCOUNTER — Other Ambulatory Visit: Payer: Self-pay | Admitting: Cardiology

## 2022-09-04 ENCOUNTER — Other Ambulatory Visit: Payer: Self-pay | Admitting: Cardiology

## 2022-11-06 ENCOUNTER — Ambulatory Visit: Payer: Medicare Other | Attending: Cardiology | Admitting: Cardiology

## 2022-11-06 ENCOUNTER — Encounter: Payer: Self-pay | Admitting: Cardiology

## 2022-11-06 VITALS — BP 183/89 | HR 61 | Ht 65.0 in | Wt 155.0 lb

## 2022-11-06 DIAGNOSIS — Z79899 Other long term (current) drug therapy: Secondary | ICD-10-CM

## 2022-11-06 DIAGNOSIS — I10 Essential (primary) hypertension: Secondary | ICD-10-CM | POA: Diagnosis not present

## 2022-11-06 DIAGNOSIS — I4819 Other persistent atrial fibrillation: Secondary | ICD-10-CM | POA: Diagnosis not present

## 2022-11-06 MED ORDER — CHLORTHALIDONE 25 MG PO TABS: 25.0000 mg | ORAL_TABLET | Freq: Every day | ORAL | 3 refills | Status: DC

## 2022-11-06 NOTE — Patient Instructions (Signed)
Medication Instructions:  Please START taking chlorthalidone 25 mg daily.   *If you need a refill on your cardiac medications before your next appointment, please call your pharmacy*   Lab Work: Please scheduled to complete a CBC and a BMET in our lab in one week. You do not need to be fasting for these labs.  If you have labs (blood work) drawn today and your tests are completely normal, you will receive your results only by: MyChart Message (if you have MyChart) OR A paper copy in the mail If you have any lab test that is abnormal or we need to change your treatment, we will call you to review the results.   Testing/Procedures: None.   Follow-Up: At [facility], you and your health needs are our priority.  As part of our continuing mission to provide you with exceptional heart care, we have created designated Provider Care Teams.  These Care Teams include your primary Cardiologist (physician) and Advanced Practice Providers (APPs -  Physician Assistants and Nurse Practitioners) who all work together to provide you with the care you need, when you need it.  We recommend signing up for the patient portal called "MyChart".  Sign up information is provided on this After Visit Summary.  MyChart is used to connect with patients for Virtual Visits (Telemedicine).  Patients are able to view lab/test results, encounter notes, upcoming appointments, etc.  Non-urgent messages can be sent to your provider as well.   To learn more about what you can do with MyChart, go to [email or web address].    Your next appointment:   1 year(s)  Provider:   Armanda Magic, MD     Other Instructions Please check your blood pressure twice a day for one week. Write down your readings as well as the date and time then call our office [phone]) and submit your blood pressure log. You can also submit your readings over MyChart.

## 2022-11-06 NOTE — Progress Notes (Signed)
Cardiology Office Note:    Date:  11/06/2022   ID:  Jennifer Webster, DOB 29-Oct-1950, MRN [id number]  PCP:  Patient, No Pcp Per  Cardiologist:  Armanda Magic, MD    Referring MD: No ref. provider found   Chief Complaint  Patient presents with   Atrial Fibrillation   Hypertension   Hyperlipidemia    History of Present Illness:    Jennifer Webster is a 72 y.o. female with a hx of persistent AF and HTN.  She is here today for followup and is doing well.  She denies any chest pain or pressure, SOB, DOE, PND, orthopnea, dizziness, palpitations or syncope. Occasionally she will have some mild Le edema in the evenings.  She is compliant with her meds and is tolerating meds with no SE.    Past Medical History:  Diagnosis Date   Depression    HTN (hypertension)    Persistent atrial fibrillation (HCC)    Pure hypercholesterolemia     No past surgical history on file.  Current Medications: Current Meds  Medication Sig   acebutolol (SECTRAL) 200 MG capsule TAKE 1 CAPSULE BY MOUTH TWICE A DAY   flecainide (TAMBOCOR) 100 MG tablet TAKE 1 TABLET BY MOUTH TWICE A DAY   lisinopril (ZESTRIL) 40 MG tablet TAKE 1 TABLET BY MOUTH ONCE DAILY . APPOINTMENT REQUIRED FOR FUTURE REFILLS   rivaroxaban (XARELTO) 20 MG TABS tablet Take 1 tablet (20 mg total) by mouth daily with supper.     Allergies:   Nickel   Social History   Socioeconomic History   Marital status: Married    Spouse name: Not on file   Number of children: Not on file   Years of education: Not on file   Highest education level: Not on file  Occupational History   Not on file  Tobacco Use   Smoking status: Former   Smokeless tobacco: Never  Vaping Use   Vaping status: Never Used  Substance and Sexual Activity   Alcohol use: Yes    Comment: 2+ per week   Drug use: No   Sexual activity: Not on file  Other Topics Concern   Not on file  Social History Narrative   Not on file   Social Determinants of Health    Financial Resource Strain: Not on file  Food Insecurity: Not on file  Transportation Needs: Not on file  Physical Activity: Not on file  Stress: Not on file  Social Connections: Not on file     Family History: The patient's family history includes Diabetes in her daughter. There is no history of Heart attack.  ROS:   Please see the history of present illness.    Review of Systems  Musculoskeletal:  Negative for arthritis.    All other systems reviewed and negative.   EKGs/Labs/Other Studies Reviewed:    The following studies were reviewed today: EKG  Recent Labs: No results found for requested labs within last 365 days.   Recent Lipid Panel No results found for: "CHOL", "TRIG", "HDL", "CHOLHDL", "VLDL", "LDLCALC", "LDLDIRECT"  Physical Exam:    VS:  BP (!) 183/89   Pulse 61   Ht 5\' 5"  (1.651 m)   Wt 155 lb (70.3 kg)   SpO2 95%   BMI 25.79 kg/m     Wt Readings from Last 3 Encounters:  11/06/22 155 lb (70.3 kg)  08/01/21 149 lb 6.4 oz (67.8 kg)  06/26/20 140 lb (63.5 kg)    GEN: Well nourished,  well developed in no acute distress HEENT: Normal NECK: No JVD; No carotid bruits LYMPHATICS: No lymphadenopathy CARDIAC:RRR, no murmurs, rubs, gallops RESPIRATORY:  Clear to auscultation without rales, wheezing or rhonchi  ABDOMEN: Soft, non-tender, non-distended MUSCULOSKELETAL:  No edema; No deformity  SKIN: Warm and dry NEUROLOGIC:  Alert and oriented x 3 PSYCHIATRIC:  Normal affect   ASSESSMENT:    1. Persistent atrial fibrillation (HCC)   2. Primary hypertension     PLAN:    In order of problems listed above:  1.  Paroxysmal atrial fibrillation -Mains in normal sinus rhythm on exam today and has not had any palpitations since I saw her last -EKG shows normal QRS and PR intervals -Continue prescription drug management with Sectral 200 mg twice daily, flecainide 100 mg twice daily and Xarelto 20 mg daily with as needed refills -Check BMP and CBC  today -EKG with nonspecific T wave abnormality and stable intervals (QRS and PR )  2.  HTN -BP elevated today but she has a known history of whitecoat hypertension.  Blood pressures at home have been elevated (systolic ) but she has been under stress -Continue prescription drug management with Sectral 200 mg twice daily and lisinopril 40 mg daily with as needed refills -add chlorthalidone 25mg  daily -Check BMP in 1 week   Medication Adjustments/Labs and Tests Ordered: Current medicines are reviewed at length with the patient today.  Concerns regarding medicines are outlined above.  Orders Placed This Encounter  Procedures   EKG 12-Lead   No orders of the defined types were placed in this encounter.   Signed, Armanda Magic, MD  11/06/2022 8:32 AM    [facility]

## 2022-11-13 ENCOUNTER — Telehealth: Payer: Self-pay | Admitting: Cardiology

## 2022-11-13 ENCOUNTER — Ambulatory Visit: Payer: Medicare Other | Attending: Cardiology

## 2022-11-13 DIAGNOSIS — Z79899 Other long term (current) drug therapy: Secondary | ICD-10-CM

## 2022-11-13 DIAGNOSIS — I10 Essential (primary) hypertension: Secondary | ICD-10-CM | POA: Diagnosis not present

## 2022-11-13 NOTE — Telephone Encounter (Signed)
Patient dropped off the following information for blood pressure....  9/7  AM : 134/67 Pulse 57  PM : 137/67 Pulse 61  9/8 AM : 112/54 Pulse 50 PM 107/60 Pulse 65  9/9 AM : 126/63 Pulse 59 PM : 110/63 Pulse 65  9/10 AM : 124/63 Pulse 54 PM 108/53 Pulse 66  9/11 AM : 124/56 Pulse 65 PM : 113/53 Pulse 67  9/12 AM : 113/54 Pulse 63 PM : 103/48 Pulse 66  9/13 AM : 125/61 Pulse 53  Patients original written out copy in mailbox

## 2022-11-14 LAB — CBC WITH DIFFERENTIAL/PLATELET
Basophils Absolute: 0 10*3/uL (ref 0.0–0.2)
Basos: 1 %
EOS (ABSOLUTE): 0.2 10*3/uL (ref 0.0–0.4)
Eos: 4 %
Hematocrit: 40.8 % (ref 34.0–46.6)
Hemoglobin: 13.8 g/dL (ref 11.1–15.9)
Immature Grans (Abs): 0 10*3/uL (ref 0.0–0.1)
Immature Granulocytes: 0 %
Lymphocytes Absolute: 1.1 10*3/uL (ref 0.7–3.1)
Lymphs: 20 %
MCH: 31.9 pg (ref 26.6–33.0)
MCHC: 33.8 g/dL (ref 31.5–35.7)
MCV: 94 fL (ref 79–97)
Monocytes Absolute: 0.6 10*3/uL (ref 0.1–0.9)
Monocytes: 11 %
Neutrophils Absolute: 3.3 10*3/uL (ref 1.4–7.0)
Neutrophils: 64 %
Platelets: 239 10*3/uL (ref 150–450)
RBC: 4.33 x10E6/uL (ref 3.77–5.28)
RDW: 11.8 % (ref 11.7–15.4)
WBC: 5.2 10*3/uL (ref 3.4–10.8)

## 2022-11-14 LAB — BASIC METABOLIC PANEL
BUN/Creatinine Ratio: 21 (ref 12–28)
BUN: 28 mg/dL — ABNORMAL HIGH (ref 8–27)
CO2: 23 mmol/L (ref 20–29)
Calcium: 9.2 mg/dL (ref 8.7–10.3)
Chloride: 96 mmol/L (ref 96–106)
Creatinine, Ser: 1.35 mg/dL — ABNORMAL HIGH (ref 0.57–1.00)
Glucose: 94 mg/dL (ref 70–99)
Potassium: 4.3 mmol/L (ref 3.5–5.2)
Sodium: 133 mmol/L — ABNORMAL LOW (ref 134–144)
eGFR: 42 mL/min/{1.73_m2} — ABNORMAL LOW (ref >59)

## 2022-11-18 ENCOUNTER — Telehealth: Payer: Self-pay | Admitting: Cardiology

## 2022-11-18 DIAGNOSIS — I10 Essential (primary) hypertension: Secondary | ICD-10-CM

## 2022-11-18 DIAGNOSIS — Z79899 Other long term (current) drug therapy: Secondary | ICD-10-CM

## 2022-11-18 MED ORDER — AMLODIPINE BESYLATE 5 MG PO TABS: 5.0000 mg | ORAL_TABLET | Freq: Every day | ORAL | 6 refills | Status: DC

## 2022-11-18 NOTE — Telephone Encounter (Signed)
Pt was returning nurse call regarding results and is requesting a callback. Please advise

## 2022-11-18 NOTE — Telephone Encounter (Signed)
Pt made aware of result findings/MD advisement. Pt agreeable to plan. Advised to call office if SE begin after starting new medication. Rx sent to [facility] per pt request. Pt will stop by the office 9/30 for follow up BMET. Patient verbalized understanding and agreeable to plan.

## 2022-11-18 NOTE — Telephone Encounter (Signed)
Quintella Reichert, MD 11/17/2022  3:38 PM EDT     SCr bumped after starting Chlorthalidone - please stop the Chlorthalidone and start Amlodipine 5mg  daily and check BP daily for a week and call with results.  Repeat BMET in 1 week

## 2022-11-20 NOTE — Telephone Encounter (Signed)
Call to patient to review that BP log she turned in from early September looks good per Dr. Mayford Knife. Reviewed with patient that on 11/16/22, her meds were changed and she was instructed to also repeat BMET 11/30/22 due to elevation in Cr when she was on chlorthalidone (which was changed to amlodipine on 11/17/22). Patient verbalizes understanding to continue on current meds, check BP daily, and have labs rechecked 11/30/22. Patient states she will turn in BP log when she comes in for labs.

## 2022-11-22 ENCOUNTER — Other Ambulatory Visit: Payer: Self-pay | Admitting: Cardiology

## 2022-11-23 ENCOUNTER — Other Ambulatory Visit: Payer: Self-pay | Admitting: Cardiology

## 2022-11-23 DIAGNOSIS — I4819 Other persistent atrial fibrillation: Secondary | ICD-10-CM

## 2022-11-23 NOTE — Telephone Encounter (Signed)
Prescription refill request for Xarelto received.  Indication: AF Last office visit: 11/06/22  Kym Groom MD Weight: 70.3kg Age: 72 Scr: 1.35 on 11/13/22  Epic CrCl: 41.80  First time Scr has been elevated in 7 years after being started on Chlorthalidone.  Chlorthalidine has been stopped by Dr Mayford Knife with F/U BMP.  Will continue Xarelto 20mg  daily and monitor upcoming repeat Scr to see if INR has returned to normal.  Refill approved x 1 only.

## 2022-11-30 ENCOUNTER — Ambulatory Visit: Payer: Medicare Other | Attending: Cardiology

## 2022-11-30 ENCOUNTER — Telehealth: Payer: Self-pay | Admitting: Cardiology

## 2022-11-30 DIAGNOSIS — I10 Essential (primary) hypertension: Secondary | ICD-10-CM

## 2022-11-30 DIAGNOSIS — Z79899 Other long term (current) drug therapy: Secondary | ICD-10-CM

## 2022-11-30 LAB — BASIC METABOLIC PANEL
BUN/Creatinine Ratio: 14 (ref 12–28)
BUN: 17 mg/dL (ref 8–27)
CO2: 22 mmol/L (ref 20–29)
Calcium: 9.2 mg/dL (ref 8.7–10.3)
Chloride: 105 mmol/L (ref 96–106)
Creatinine, Ser: 1.25 mg/dL — ABNORMAL HIGH (ref 0.57–1.00)
Glucose: 123 mg/dL — ABNORMAL HIGH (ref 70–99)
Potassium: 4.2 mmol/L (ref 3.5–5.2)
Sodium: 140 mmol/L (ref 134–144)
eGFR: 46 mL/min/{1.73_m2} — ABNORMAL LOW (ref >59)

## 2022-11-30 NOTE — Telephone Encounter (Signed)
Patient dropped off BP readings for Dr. Mayford Knife. We will place in her box by the end of today.

## 2022-12-01 NOTE — Telephone Encounter (Signed)
Patient turned in the following blood pressure log:  Patient notes she stopped taking chlorthalidone and started 5 mg amlodipine besylate 11/18/22.  11/19/22: 8:30 AM 114/59 HR 51 6:30 PM 105/50 HR 75  11/20/22: 8:40 AM 109/55 HR 55 7:00 PM 104/49 HR 61  11/21/22: 9:00 AM 113/55 HR 52 6:50 PM 112/54 HR 64   11/22/22: 6:45 AM 113/57 HR 52 6:30 PM 115/52 HR 60  11/23/22:  11:45 AM 122/55 HR 55 6:05 PM   112/53 HR 64  11/24/22: 8:10 AM 121/58 HR 58 6:00 PM 128/58 HR 60  11/25/22: 9:05 AM 139/67 HR 57 6:00 PM 100/58 HR 62  11/26/22: 8:30 AM 101/58 HR 63 6:30 PM 98/47  HR 64  11/27/22: 8:50 AM 128/60 HR 59 6:50 PM 111/58 HR 64  11/28/22: 9:45 AM 106/56 HR 56 6:05 PM 121/56 HR 59  11/29/22: 8:10 AM 149/68 HR 50 7:00 PM 114/60 HR 65

## 2022-12-01 NOTE — Telephone Encounter (Signed)
Patient states she does not yet have a PCP, she is looking for one and will let us know when she has one so records can be forwarded.

## 2022-12-01 NOTE — Telephone Encounter (Signed)
Call to patient to review Dr. Norris Cross comments on blood pressure log. Patient verbalizes understanding that Dr. Norris Cross feels BP are "great" and she does not need to make any changes to her medical therapy. Patient states she has noticed some swelling in her lower legs and feet at the end of the day. She states this is mild and does not affect her ability to get her shoes/socks on or walk. Patient responses forwarded to Dr. Mayford Knife.   Also reviewed stable BMET  results, patient verbalizes understanding. Forwarded results to PCP.

## 2022-12-08 MED ORDER — IRBESARTAN 300 MG PO TABS: 300.0000 mg | ORAL_TABLET | Freq: Every day | ORAL | 3 refills | Status: DC

## 2022-12-08 NOTE — Addendum Note (Signed)
Addended by: Luellen Pucker on: 12/08/2022 10:43 AM   Modules accepted: Orders

## 2022-12-08 NOTE — Telephone Encounter (Signed)
Call to patient to advise that Dr. Mayford Knife feels it's  likely the amlodipine caused the LE edema.  Per Dr. Mayford Knife, stopped amlodipine, Lisinopril and started Irbesartan 300mg  daily  and advised patient to have BMET in 1 week.  Explained that Irbesartan is a more potent BP med than Lisinopril so hopefully will work better for BP.  Patient agrees to check BP twice daily for a week and call with results.

## 2022-12-16 ENCOUNTER — Telehealth: Payer: Self-pay | Admitting: Cardiology

## 2022-12-16 ENCOUNTER — Ambulatory Visit: Payer: Medicare Other | Attending: Cardiology

## 2022-12-16 DIAGNOSIS — Z79899 Other long term (current) drug therapy: Secondary | ICD-10-CM

## 2022-12-16 LAB — BASIC METABOLIC PANEL
BUN/Creatinine Ratio: 17 (ref 12–28)
BUN: 20 mg/dL (ref 8–27)
CO2: 22 mmol/L (ref 20–29)
Calcium: 9.1 mg/dL (ref 8.7–10.3)
Chloride: 104 mmol/L (ref 96–106)
Creatinine, Ser: 1.19 mg/dL — ABNORMAL HIGH (ref 0.57–1.00)
Glucose: 85 mg/dL (ref 70–99)
Potassium: 4.5 mmol/L (ref 3.5–5.2)
Sodium: 139 mmol/L (ref 134–144)
eGFR: 49 mL/min/{1.73_m2} — ABNORMAL LOW (ref >59)

## 2022-12-16 NOTE — Telephone Encounter (Signed)
Paper Work Dropped Off: Blood pressure readings  Date:12/16/2022  Location of paper: Admin office white mail bin

## 2022-12-17 ENCOUNTER — Telehealth: Payer: Self-pay

## 2022-12-17 MED ORDER — APIXABAN 5 MG PO TABS: 5.0000 mg | ORAL_TABLET | Freq: Two times a day (BID) | ORAL | 12 refills | Status: DC

## 2022-12-17 NOTE — Telephone Encounter (Signed)
Call to follow up on blood pressure log, no answer, left detailed message per DPR asking patient to call our office or submit bp's over Mychart.

## 2022-12-17 NOTE — Telephone Encounter (Signed)
Call to patient to explain creatinine clearance remains < 50cc/min - Per Dr. Mayford Knife she needs to stop Xarelto and start Eliquis 5mg  BID. Patient verbalizes understanding and agrees to plan.

## 2022-12-17 NOTE — Telephone Encounter (Signed)
-----   Message from Armanda Magic sent at 12/16/2022  8:28 PM EDT ----- Creatinine clearance remains < 50cc/min - need to stop Xarelto and start Eliquis 5mg  BID

## 2022-12-21 DIAGNOSIS — Z23 Encounter for immunization: Secondary | ICD-10-CM | POA: Diagnosis not present

## 2022-12-21 NOTE — Telephone Encounter (Signed)
Patient provided the following BP log. She states that she started Irbesartan on 12/09/22 and stopped lisinopril and amlodipine on 12/08/22, which is what our records reflect.  12/09/22:  3:00 pm 173/75 HR 53 7:15 pm  106/54 HR 58  12/10/22:  12:30 pm 153/75 HR 50 7:40 pm    121/59 HR 58  12/11/22: 1:15 pm  159/66 HR 50 7:30 pm  104/50 HR 51  12/12/22:  12:00 pm 127/64 HR 59 6:00 pm    104/53 HR 60  12/13/22: 12:00 pm 136/60 HR 64 6:30 pm   105/53 HR 64  12/14/22:  1:00 pm 133/69 HR 54 6:00 pm 129/58 HR 54  12/15/22:  1:30 pm  158/61 HR 55 7:00 pm 131/65 HR 62

## 2022-12-22 NOTE — Telephone Encounter (Signed)
Call to patient to verify time of day she takes her irbesartan. Patient states she takes it early morning, around 6 or 7 am. Patient responses forwarded to Dr. Mayford Knife.

## 2022-12-31 NOTE — Telephone Encounter (Signed)
Spoke with patient regarding her when she takes her medications and when she checks her BP. Patient states "I have had so many changes to my medications lately that I did have some days where I was forgetting to check my BP." Reviewed med list with patient. Patient agrees to try taking irbesartan, AM dose acebutolol,  and AM dose flecainide before 10 am. Then she will chck her BP between 11am- 1 pm. She will take PM dose acebutolol and pm dose flecainide at dinner and check BP again before bed. Patient will call our office to report her readings in one week.

## 2022-12-31 NOTE — Telephone Encounter (Signed)
Call to patient to discuss BP log, no answer, left detailed message per [facility] asking patient to call our office or submit BP log over MyChart.

## 2022-12-31 NOTE — Telephone Encounter (Signed)
See encounter from 12/16/22.

## 2022-12-31 NOTE — Telephone Encounter (Signed)
See telephone note 12/31/22.

## 2023-01-11 ENCOUNTER — Telehealth: Payer: Self-pay | Admitting: Cardiology

## 2023-01-11 NOTE — Telephone Encounter (Signed)
  Pt c/o BP issue: STAT if pt c/o blurred vision, one-sided weakness or slurred speech  1. What are your last 5 BP readings?  Patient checked twice a day lunch time and bed time 11/01  121/62 59 123/64 48 11/02 136/71 51 111/51 67 11/03 140/69 59 117/57 72 11/04 122/64 53 114/54 69 11/05 146/69 56 123/61 70 11/06 153/71 63 117/55 65 11/07 130/64 52 117/52 69 11/08  150/54 55  135/62 73  2. Are you having any other symptoms (ex. Dizziness, headache, blurred vision, passed out)? None   3. What is your BP issue? Pt returning Erica's call to give BP reading

## 2023-01-11 NOTE — Telephone Encounter (Signed)
Called to check on Bp log, no answer, left detailed message per [facility] asking patient to call our office to submit BP readings.

## 2023-01-13 ENCOUNTER — Telehealth: Payer: Self-pay

## 2023-01-13 NOTE — Telephone Encounter (Signed)
Patient calling in asked what over the counter pain relievers are safe to take with her current medications. Forwarded to Pharm D.

## 2023-01-13 NOTE — Telephone Encounter (Signed)
Call to patient who verbalizes understanding to continue current medication regimen for blood pressure control.

## 2023-01-14 NOTE — Telephone Encounter (Signed)
Pt advised of recommendation per PharmD as below.  Pt verbalizes understanding and thanked Charity fundraiser for the call.

## 2023-01-14 NOTE — Telephone Encounter (Signed)
Tylenol is ok or she can try a topical like icy hot, some increased bleeding risk with voltaren gel, but risk is low.  No NSAID'S

## 2023-02-02 NOTE — Telephone Encounter (Signed)
 See telephone encounter 01/12/23

## 2023-11-04 ENCOUNTER — Other Ambulatory Visit: Payer: Self-pay | Admitting: Cardiology

## 2023-11-21 ENCOUNTER — Other Ambulatory Visit: Payer: Self-pay | Admitting: Cardiology

## 2023-11-28 ENCOUNTER — Other Ambulatory Visit: Payer: Self-pay | Admitting: Cardiology

## 2023-12-05 ENCOUNTER — Other Ambulatory Visit: Payer: Self-pay | Admitting: Cardiology

## 2023-12-06 NOTE — Progress Notes (Signed)
  Cardiology Office Note:  .   Date:  12/13/2023  ID:  Jennifer Webster, DOB [birth year], MRN [id number] PCP: Patient, No Pcp Per  [facility] Providers Cardiologist:  Wilbert Bihari, MD    History of Present Illness: .   Jennifer Webster is a 73 y.o. female with history of persistent Afib and HTN.  Patient here for f/u. BP high here. Not checking much at home. Denies chest pain, palpitations, dyspnea, edema. No regular exercise. Watches youtube all day. Getting too much salt-ramen, soy sauce, drinks 2 glasses of wine a day.   ROS:    Studies Reviewed: SABRA    EKG Interpretation Date/Time:  Monday December 13 2023 09:54:20 EDT Ventricular Rate:  66 PR Interval:  218 QRS Duration:  120 QT Interval:  424 QTC Calculation: 444 R Axis:   -25  Text Interpretation: Sinus rhythm with 1st degree A-V block Non-specific intra-ventricular conduction delay When compared with ECG of 06-Nov-2022 08:42, T wave inversion no longer evident in Anterolateral leads Confirmed by Parthenia Klinefelter [phone]) on 12/13/2023 9:59:10 AM    Prior CV Studies:      Risk Assessment/Calculations:    CHA2DS2-VASc Score = 3   This indicates a 3.2% annual risk of stroke. The patient's score is based upon: CHF History: 0 HTN History: 1 Diabetes History: 0 Stroke History: 0 Vascular Disease History: 0 Age Score: 1 Gender Score: 1    HYPERTENSION CONTROL Vitals:   12/13/23 0948 12/13/23 1011  BP: (!) 204/90 (!) 200/100    The patient's blood pressure is elevated above target today.  In order to address the patient's elevated BP: Blood pressure will be monitored at home to determine if medication changes need to be made.; The blood pressure is usually elevated in clinic.  Blood pressures monitored at home have been optimal.; Follow up with general cardiology has been recommended.          Physical Exam:   VS:  BP (!) 200/100   Pulse 66   Ht 5' 4 (1.626 m)   Wt 156 lb (70.8 kg)   SpO2 97%   BMI  26.78 kg/m    Orhtostatics: No data found. Wt Readings from Last 3 Encounters:  12/13/23 156 lb (70.8 kg)  11/06/22 155 lb (70.3 kg)  08/01/21 149 lb 6.4 oz (67.8 kg)    GEN: Well nourished, well developed in no acute distress NECK: No JVD; No carotid bruits CARDIAC:  RRR, no murmurs, rubs, gallops RESPIRATORY:  Clear to auscultation without rales, wheezing or rhonchi  ABDOMEN: Soft, non-tender, non-distended EXTREMITIES:  No edema; No deformity   ASSESSMENT AND PLAN: .    Paroxysmal atrial fibrillation - in normal sinus rhythm on EKG-normal QRS & PR intervals -Continue Sectral  200 mg twice daily, flecainide  100 mg twice daily and eliquis   -Check CMET and CBC TSH, FLP next week      HTN -BP elevated today but she has a known history of whitecoat hypertension. Not checking Blood pressures at home   -Continu Sectral  200 mg twice daily and irbestartan 300 mg daily  - 2 gm sodium diet -150 min exercise weekly -check BP's at home and send readings in in 1-2 weeks.    Lipid screening-check FLP        Dispo: f/u Dr. Bihari in 6 months.  Signed, Klinefelter Parthenia, PA-C

## 2023-12-13 ENCOUNTER — Ambulatory Visit: Attending: Physician Assistant | Admitting: Physician Assistant

## 2023-12-13 VITALS — BP 200/100 | HR 66 | Ht 64.0 in | Wt 156.0 lb

## 2023-12-13 DIAGNOSIS — I10 Essential (primary) hypertension: Secondary | ICD-10-CM | POA: Diagnosis present

## 2023-12-13 DIAGNOSIS — I4819 Other persistent atrial fibrillation: Secondary | ICD-10-CM | POA: Insufficient documentation

## 2023-12-13 DIAGNOSIS — Z13220 Encounter for screening for lipoid disorders: Secondary | ICD-10-CM | POA: Diagnosis not present

## 2023-12-13 MED ORDER — APIXABAN 5 MG PO TABS: 5.0000 mg | ORAL_TABLET | Freq: Two times a day (BID) | ORAL | 3 refills | Status: DC

## 2023-12-13 MED ORDER — FLECAINIDE ACETATE 100 MG PO TABS: 100.0000 mg | ORAL_TABLET | Freq: Two times a day (BID) | ORAL | 3 refills | Status: AC

## 2023-12-13 MED ORDER — ACEBUTOLOL HCL 200 MG PO CAPS: 200.0000 mg | ORAL_CAPSULE | Freq: Two times a day (BID) | ORAL | 3 refills | Status: AC

## 2023-12-13 MED ORDER — IRBESARTAN 300 MG PO TABS: 300.0000 mg | ORAL_TABLET | Freq: Every day | ORAL | 3 refills | Status: AC

## 2023-12-13 NOTE — Patient Instructions (Addendum)
 Thank you for choosing [facility]!     Medication Instructions:  Monitor your blood pressure for 2 weeks. Upload the reading to your mychart.  *If you need a refill on your cardiac medications before your next appointment, please call your pharmacy*   Lab Work: CMET, LIPID, CBC, TSH  If you have labs (blood work) drawn today and your tests are completely normal, you will receive your results only by: MyChart Message (if you have MyChart) OR A paper copy in the mail If you have any lab test that is abnormal or we need to change your treatment, we will call you to review the results.   Testing/Procedures: No procedures were ordered during today's visit.   Your next appointment:  A letter will be mailed to you as a reminder to call the office for your next follow up appointment.  6 month(s)   Provider:   Wilbert Bihari, MD     Follow-Up: At [facility], you and your health needs are our priority.  As part of our continuing mission to provide you with exceptional heart care, we have created designated Provider Care Teams.  These Care Teams include your primary Cardiologist (physician) and Advanced Practice Providers (APPs -  Physician Assistants and Nurse Practitioners) who all work together to provide you with the care you need, when you need it. We recommend signing up for the patient portal called MyChart.  Sign up information is provided on this After Visit Summary.  MyChart is used to connect with patients for Virtual Visits (Telemedicine).  Patients are able to view lab/test results, encounter notes, upcoming appointments, etc.  Non-urgent messages can be sent to your provider as well.   To learn more about what you can do with MyChart, go to [email or web address].

## 2023-12-15 ENCOUNTER — Ambulatory Visit: Payer: Self-pay | Admitting: Physician Assistant

## 2023-12-15 LAB — CBC
Hematocrit: 41.7 % (ref 34.0–46.6)
Hemoglobin: 13.7 g/dL (ref 11.1–15.9)
MCH: 32.4 pg (ref 26.6–33.0)
MCHC: 32.9 g/dL (ref 31.5–35.7)
MCV: 99 fL — ABNORMAL HIGH (ref 79–97)
Platelets: 192 x10E3/uL (ref 150–450)
RBC: 4.23 x10E6/uL (ref 3.77–5.28)
RDW: 11.7 % (ref 11.7–15.4)
WBC: 6.4 x10E3/uL (ref 3.4–10.8)

## 2023-12-15 LAB — LIPID PANEL
Chol/HDL Ratio: 3.1 ratio (ref 0.0–4.4)
Cholesterol, Total: 200 mg/dL — ABNORMAL HIGH (ref 100–199)
HDL: 65 mg/dL (ref >39)
LDL Chol Calc (NIH): 111 mg/dL — ABNORMAL HIGH (ref 0–99)
Triglycerides: 140 mg/dL (ref 0–149)
VLDL Cholesterol Cal: 24 mg/dL (ref 5–40)

## 2023-12-15 LAB — COMPREHENSIVE METABOLIC PANEL WITH GFR
ALT: 19 IU/L (ref 0–32)
AST: 25 IU/L (ref 0–40)
Albumin: 4.3 g/dL (ref 3.8–4.8)
Alkaline Phosphatase: 68 IU/L (ref 49–135)
BUN/Creatinine Ratio: 18 (ref 12–28)
BUN: 22 mg/dL (ref 8–27)
Bilirubin Total: 0.8 mg/dL (ref 0.0–1.2)
CO2: 22 mmol/L (ref 20–29)
Calcium: 9.4 mg/dL (ref 8.7–10.3)
Chloride: 100 mmol/L (ref 96–106)
Creatinine, Ser: 1.25 mg/dL — ABNORMAL HIGH (ref 0.57–1.00)
Globulin, Total: 2.6 g/dL (ref 1.5–4.5)
Glucose: 89 mg/dL (ref 70–99)
Potassium: 4.5 mmol/L (ref 3.5–5.2)
Sodium: 139 mmol/L (ref 134–144)
Total Protein: 6.9 g/dL (ref 6.0–8.5)
eGFR: 46 mL/min/1.73 — ABNORMAL LOW (ref >59)

## 2023-12-15 LAB — TSH: TSH: 1.93 u[IU]/mL (ref 0.450–4.500)

## 2024-01-11 ENCOUNTER — Telehealth: Payer: Self-pay

## 2024-01-11 DIAGNOSIS — I10 Essential (primary) hypertension: Secondary | ICD-10-CM

## 2024-01-11 NOTE — Telephone Encounter (Signed)
-----   Message from Wilbert Bihari sent at 01/06/2024  5:40 PM EST ----- BP too high - add Amlodipine  2.5mg  daily and check BP at lunch and dinner for a week and call with results ----- Message ----- From: Smitty Jonel CROME Sent: 01/06/2024   4:49 PM EST To: Wilbert JONELLE Bihari, MD; Geni CROME Sar, RN

## 2024-01-11 NOTE — Telephone Encounter (Signed)
 Call to patient to discuss Dr. Dorine recommendations. No answer, no updated DPR on file, left voice mail with no identifiers asking recipient to call [place] at our office #.

## 2024-01-12 NOTE — Telephone Encounter (Signed)
 Patient returned RN Erica's call and stated can leave a voice message.

## 2024-01-12 NOTE — Telephone Encounter (Signed)
 Pt aware still awaiting Dr. Dorine response,  Aware office will be in touch once she does.

## 2024-01-12 NOTE — Telephone Encounter (Signed)
 Pt advised of MD recommendation.  Pt states she has tried Amlodipine  in the past and had severe swelling and it was stopped.  Pt aware will send back to MD for review/updated advisement.  Pt said when someone calls her back with Dr. Dorine advisement, to please leave a detailed message if she does not answer.

## 2024-01-12 NOTE — Telephone Encounter (Signed)
 Patient states that she thinks Jennifer Webster was calling her back. She said that her Answering Machine malfunctioned in the middle of leaving the VM so was unable to get the message.

## 2024-01-13 ENCOUNTER — Telehealth: Payer: Self-pay

## 2024-01-13 NOTE — Telephone Encounter (Signed)
 See telephone encounter 01/11/24, patient has appt with pharm D in December 2025.

## 2024-01-13 NOTE — Telephone Encounter (Signed)
 Shlomo Wilbert SAUNDERS, MD to [facility] Triage (Selected Message)     01/12/24  6:04 PM Refer to HTN clinic - she has multiple intolerances to BP meds (chlorthalidone >>AKI and Amlodipine  LE edema).  On max dose ARB.  On sectral  for PAF with hx of bradycardia.   Pt is aware of the above information and that the order has been placed.  Advised she will be contacted to be scheduled and that her appt will her here at the [facility] office. She will await a call back for the appointment.

## 2024-01-13 NOTE — Telephone Encounter (Signed)
-----   Message from Wilbert Bihari sent at 01/06/2024  5:40 PM EST ----- BP too high - add Amlodipine  2.5mg  daily and check BP at lunch and dinner for a week and call with results ----- Message ----- From: Smitty Jonel CROME Sent: 01/06/2024   4:49 PM EST To: Wilbert JONELLE Bihari, MD; Geni CROME Sar, RN

## 2024-01-13 NOTE — Addendum Note (Signed)
 Addended by: THEOTIS SHARLET PARAS on: 01/13/2024 08:34 AM   Modules accepted: Orders

## 2024-02-12 ENCOUNTER — Other Ambulatory Visit: Payer: Self-pay | Admitting: Physician Assistant

## 2024-02-14 NOTE — Telephone Encounter (Signed)
 Pt last saw Olivia Pavy, PA on 12/13/23, last labs 12/14/23 Creat 1.25, age 73, weight 70.8kg, based on specified criteria pt is on appropriate dosage of Eliquis  5mg  BID for afib.  Will refill rx.

## 2024-02-15 ENCOUNTER — Ambulatory Visit: Attending: Internal Medicine

## 2024-02-15 VITALS — BP 199/97 | HR 62

## 2024-02-15 DIAGNOSIS — I10 Essential (primary) hypertension: Secondary | ICD-10-CM

## 2024-02-15 MED ORDER — AMLODIPINE BESYLATE 2.5 MG PO TABS: 2.5000 mg | ORAL_TABLET | Freq: Every evening | ORAL | 2 refills | Status: AC

## 2024-02-15 NOTE — Progress Notes (Signed)
 Patient ID: [name]                 DOB: [birth year]                      MRN: [id number]      HPI: Jennifer Webster is a 73 y.o. female referred by Dr. Shlomo to HTN clinic. PMH is significant for HTN, atrial fibrillation, and HLD.   Patient was last evaluated in October 2025 for follow up of atrial fibrillation and hypertension. She was hypertensive in office (200/100), but has a known history of Sevan Mcbroom coat hypertension. She was not checking blood pressures at home and consuming too much salt in diet, sedentary as well. She denied any chest pain, palpitations, dyspnea, and edema. She was continued on irbesartan  300 mg daily and instructed to check blood pressure at home and send readings in 1-2 weeks. Blood pressure readings were scanned into chart, mostly ranging in mid 140s-150s/60-70s. Provider recommended to add on amlodipine  2.5 mg daily and check BP at lunch and dinner for a week and call with results. Patient states that she has tried amlodipine  in the past and had severe leg swelling and then stopped. PharmD referral placed.  Patient presents today in good spirits. She is currently taking irbesartan  300 mg daily for HTN management. She is also taking acebutolol  200 mg daily for atrial fibrillation. She has tried amlodipine  in the past, likely 5 mg daily based on chart review and experience leg swelling and eventually stopped. She confirmed no anaphylaxis to amlodipine , only leg swelling. She hasn't been on amlodipine  for over a year. She also tried chlorthalidone  in the past and it caused her Scr to raise.  Today she denies any SOB, chest pain or headache. She says that she always gets nervous at the office and it has been this way for some time. She did not bring in log as she has not been checking since October so I referred to her home readings that she reported in October which were mostly mid 140s-150s/60-70s.  She reports that she eats mostly ramen and turkey sandwiches. She  continues to drink ~8 oz of wine every day.    Current HTN meds: irbesartan  300 mg daily, acebutolol  200 mg daily (for Afib) Previously tried: chlorthalidone  (elevated Scr), amlodipine  (LE edema) BP goal: < 130/80 Labs (12/2023): Na 139, Cl 100, K 4.5, Scr 1.25 , Crcl, 38 ml/min  Family History:   Relation Problem Comments  Mother Metallurgist)   Father (Deceased)   Sister Metallurgist)   Sister (Alive)   Maternal Grandmother (Deceased)   Maternal Grandfather (Deceased)   Paternal Grandmother (Deceased)   Paternal Grandfather (Deceased)   Daughter Diabetes     Neg Hx Heart attack     Social History:  Alcohol: 2 glasses of wine every day Smoking: none    Diet:  Breakfast: 3 cups of black decaf coffee, Lunch/Dinner: turkey sandwich on whole wheat; she will have peppers, celery, crackers and hummus (with everything seasoning) at happy hour Snacks: rarely eat cookies and potato chips  Beverages: Happy hour every day (8 oz of wine daily), glass of water with lunch and dinner  Exercise: none   Home BP readings: no blood pressure log presented today, please refer to media for full BP log from 12/2023   Wt Readings from Last 3 Encounters:  12/13/23 156 lb (70.8 kg)  11/06/22 155 lb (70.3 kg)  08/01/21 149 lb 6.4 oz (67.8 kg)  BP Readings from Last 3 Encounters:  02/15/24 (!) 199/97  12/13/23 (!) 200/100  11/06/22 (!) 183/89   Pulse Readings from Last 3 Encounters:  02/15/24 62  12/13/23 66  11/06/22 61    Renal function: CrCl cannot be calculated (Patient's most recent lab result is older than the maximum 21 days allowed.).  Past Medical History:  Diagnosis Date   Depression    HTN (hypertension)    Persistent atrial fibrillation (HCC)    Pure hypercholesterolemia     Medications Ordered Prior to Encounter[1]  Allergies[2]  Blood pressure (!) 199/97, pulse 62.   Assessment/Plan:  1. Hypertension -  HTN (hypertension) Assessment: BP is uncontrolled in office  BP 199/97 mmHg and repeat 191/100 mmHg above the goal (<130/80). Patient with hx of Asheley Hellberg coat HTN Home blood pressure readings mostly mid 140s-150s/60-70s in October 2025 Tolerates irbesartan  well without any side effects Has tried amlodipine  5 mg in the past (leg swelling) and chlorthalidone  (elevated Scr) Will consider low dose amlodipine  at this time Denies SOB, palpitation, chest pain, headaches,or swelling Reviewed stroke and MI symptoms and when to seek immediate medical attention Reiterated the importance of regular exercise and low salt diet; instructed patient to reduce alcohol intake to 1 alcoholic beverage daily (standard drink: 12 oz of beer, 5 oz of wine)  Plan:  Start taking amlodipine  2.5 mg daily Continue taking irbesartan  300 mg daily Patient to keep record of BP readings with heart rate and report to us  at the next visit Patient to bring in BP monitor to validate at next appointment Patient to see PharmD in 4-6 weeks for follow up  Patient should contact the PharmD with any questions or concerns, or if she is unable to tolerate the low-dose amlodipine .   Thank you  Rasheema Truluck E. Justene Jensen, Pharm.D [facility]. [facility] [address], [place], [place] [address] Phone: [phone]; Fax: [phone]      [1]  Current Outpatient Medications on File Prior to Visit  Medication Sig Dispense Refill   acebutolol  (SECTRAL ) 200 MG capsule Take 1 capsule (200 mg total) by mouth 2 (two) times daily. 90 capsule 3   apixaban  (ELIQUIS ) 5 MG TABS tablet TAKE 1 TABLET BY MOUTH TWICE A DAY 60 tablet 5   flecainide  (TAMBOCOR ) 100 MG tablet Take 1 tablet (100 mg total) by mouth 2 (two) times daily. 180 tablet 3   irbesartan  (AVAPRO ) 300 MG tablet Take 1 tablet (300 mg total) by mouth daily. 90 tablet 3   No current facility-administered medications on file prior to visit.  [2]  Allergies Allergen Reactions   Nickel Itching and Swelling

## 2024-02-15 NOTE — Assessment & Plan Note (Signed)
 Assessment: BP is uncontrolled in office BP 199/97 mmHg and repeat 191/100 mmHg above the goal (<130/80). Patient with hx of Sarabelle Genson coat HTN Home blood pressure readings mostly mid 140s-150s/60-70s in October 2025 Tolerates irbesartan  well without any side effects Has tried amlodipine  5 mg in the past (leg swelling) and chlorthalidone  (elevated Scr) Will consider low dose amlodipine  at this time Denies SOB, palpitation, chest pain, headaches,or swelling Reviewed stroke and MI symptoms and when to seek immediate medical attention Reiterated the importance of regular exercise and low salt diet; instructed patient to reduce alcohol intake to 1 alcoholic beverage daily (standard drink: 12 oz of beer, 5 oz of wine)  Plan:  Start taking amlodipine  2.5 mg daily Continue taking irbesartan  300 mg daily Patient to keep record of BP readings with heart rate and report to us  at the next visit Patient to bring in BP monitor to validate at next appointment Patient to see PharmD in 4-6 weeks for follow up  Patient should contact the PharmD with any questions or concerns, or if she is unable to tolerate the low-dose amlodipine .

## 2024-02-15 NOTE — Patient Instructions (Addendum)
 Changes made by your pharmacist Ragan Duhon E. Calahan Pak, PharmD, CPP at today's visit:    Instructions/Changes  (what do you need to do) Your Notes  (what you did and when you did it)  Begin amlodipine  2.5 mg daily in the afternoon    2.  Continue irbesartan  300 mg      daily,acebutolol  200 mg daily, Eliquis  5      mg twice daily   3. Take your blood pressure 3-4 times per week and check it 2 hours after you take your medication    Bring all of your meds, your BP cuff and your record of home blood pressures to your next appointment.   Mikiah Durall E. Jayra Choyce, Pharm.D, CPP [place] [facility]. [facility] [address], [place], [place] [address] Phone: [phone]; Fax: [phone]     HOW TO TAKE YOUR BLOOD PRESSURE AT HOME  Rest 5 minutes before taking your blood pressure.  Dont smoke or drink caffeinated beverages for at least 30 minutes before. Take your blood pressure before (not after) you eat. Sit comfortably with your back supported and both feet on the floor (dont cross your legs). Elevate your arm to heart level on a table or a desk. Use the proper sized cuff. It should fit smoothly and snugly around your bare upper arm. There should be enough room to slip a fingertip under the cuff. The bottom edge of the cuff should be 1 inch above the crease of the elbow. Ideally, take 3 measurements at one sitting and record the average.  Important lifestyle changes to control high blood pressure  Intervention  Effect on the BP  Lose extra pounds and watch your waistline Weight loss is one of the most effective lifestyle changes for controlling blood pressure. If you're overweight or obese, losing even a small amount of weight can help reduce blood pressure. Blood pressure might go down by about 1 millimeter of mercury (mm Hg) with each kilogram (about 2.2 pounds) of weight lost.  Exercise regularly As a general goal, aim for at least 30 minutes of moderate  physical activity every day. Regular physical activity can lower high blood pressure by about 5 to 8 mm Hg.  Eat a healthy diet Eating a diet rich in whole grains, fruits, vegetables, and low-fat dairy products and low in saturated fat and cholesterol. A healthy diet can lower high blood pressure by up to 11 mm Hg.  Reduce salt (sodium) in your diet Even a small reduction of sodium in the diet can improve heart health and reduce high blood pressure by about 5 to 6 mm Hg.  Limit alcohol One drink equals 12 ounces of beer, 5 ounces of wine, or 1.5 ounces of 80-proof liquor.  Limiting alcohol to less than one drink a day for women or two drinks a day for men can help lower blood pressure by about 4 mm Hg.   If you have any questions or concerns please use My Chart to send questions or call the office at [phone]

## 2024-03-31 ENCOUNTER — Ambulatory Visit

## 2024-05-05 ENCOUNTER — Ambulatory Visit
